# Patient Record
Sex: Female | Born: 1995 | Race: Black or African American | Hispanic: No | Marital: Single | Smoking: Never smoker
Health system: Southern US, Community
[De-identification: ages and names within clinical notes are randomized; demographics above are authoritative.]

## PROBLEM LIST (undated history)

## (undated) DIAGNOSIS — R519 Headache, unspecified: Secondary | ICD-10-CM

## (undated) DIAGNOSIS — R011 Cardiac murmur, unspecified: Secondary | ICD-10-CM

## (undated) DIAGNOSIS — R51 Headache: Secondary | ICD-10-CM

## (undated) HISTORY — DX: Headache, unspecified: R51.9

## (undated) HISTORY — DX: Cardiac murmur, unspecified: R01.1

## (undated) HISTORY — PX: WISDOM TOOTH EXTRACTION: SHX21

## (undated) HISTORY — PX: OTHER SURGICAL HISTORY: SHX169

## (undated) HISTORY — DX: Headache: R51

---
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
## 2014-03-22 ENCOUNTER — Emergency Department (HOSPITAL_COMMUNITY): Payer: BC Managed Care – PPO

## 2014-03-22 ENCOUNTER — Emergency Department (HOSPITAL_COMMUNITY)
Admission: EM | Admit: 2014-03-22 | Discharge: 2014-03-22 | Disposition: A | Discharge status: Home / Self Care | Payer: BC Managed Care – PPO | Attending: Emergency Medicine | Admitting: Emergency Medicine

## 2014-03-22 ENCOUNTER — Encounter (HOSPITAL_COMMUNITY): Payer: Self-pay | Admitting: Family Medicine

## 2014-03-22 DIAGNOSIS — R000 Tachycardia, unspecified: Secondary | ICD-10-CM | POA: Insufficient documentation

## 2014-03-22 DIAGNOSIS — R011 Cardiac murmur, unspecified: Secondary | ICD-10-CM | POA: Insufficient documentation

## 2014-03-22 DIAGNOSIS — Z3202 Encounter for pregnancy test, result negative: Secondary | ICD-10-CM | POA: Diagnosis not present

## 2014-03-22 DIAGNOSIS — R1031 Right lower quadrant pain: Secondary | ICD-10-CM | POA: Insufficient documentation

## 2014-03-22 DIAGNOSIS — R109 Unspecified abdominal pain: Secondary | ICD-10-CM | POA: Diagnosis present

## 2014-03-22 LAB — COMPREHENSIVE METABOLIC PANEL
ALK PHOS: 70 U/L (ref 39–117)
ALT: 6 U/L (ref 0–35)
AST: 18 U/L (ref 0–37)
Albumin: 4.3 g/dL (ref 3.5–5.2)
Anion gap: 14 (ref 5–15)
BUN: 9 mg/dL (ref 6–23)
CO2: 24 meq/L (ref 19–32)
Calcium: 10 mg/dL (ref 8.4–10.5)
Chloride: 98 mEq/L (ref 96–112)
Creatinine, Ser: 0.58 mg/dL (ref 0.50–1.10)
GFR calc non Af Amer: >90 mL/min (ref >90)
GLUCOSE: 88 mg/dL (ref 70–99)
Potassium: 3.4 mEq/L — ABNORMAL LOW (ref 3.7–5.3)
Sodium: 136 mEq/L — ABNORMAL LOW (ref 137–147)
Total Bilirubin: 0.3 mg/dL (ref 0.3–1.2)
Total Protein: 8.1 g/dL (ref 6.0–8.3)

## 2014-03-22 LAB — CBC WITH DIFFERENTIAL/PLATELET
Basophils Absolute: 0.1 10*3/uL (ref 0.0–0.1)
Basophils Relative: 1 % (ref 0–1)
Eosinophils Absolute: 0.2 10*3/uL (ref 0.0–0.7)
Eosinophils Relative: 2 % (ref 0–5)
HCT: 36 % (ref 36.0–46.0)
Hemoglobin: 11.8 g/dL — ABNORMAL LOW (ref 12.0–15.0)
LYMPHS ABS: 2.2 10*3/uL (ref 0.7–4.0)
LYMPHS PCT: 37 % (ref 12–46)
MCH: 23 pg — ABNORMAL LOW (ref 26.0–34.0)
MCHC: 32.8 g/dL (ref 30.0–36.0)
MCV: 70 fL — ABNORMAL LOW (ref 78.0–100.0)
Monocytes Absolute: 0.5 10*3/uL (ref 0.1–1.0)
Monocytes Relative: 8 % (ref 3–12)
NEUTROS ABS: 3.2 10*3/uL (ref 1.7–7.7)
Neutrophils Relative %: 52 % (ref 43–77)
PLATELETS: 348 10*3/uL (ref 150–400)
RBC: 5.14 MIL/uL — ABNORMAL HIGH (ref 3.87–5.11)
RDW: 15.4 % (ref 11.5–15.5)
WBC: 6.1 10*3/uL (ref 4.0–10.5)

## 2014-03-22 LAB — URINALYSIS, ROUTINE W REFLEX MICROSCOPIC
Bilirubin Urine: NEGATIVE
Glucose, UA: NEGATIVE mg/dL
Ketones, ur: NEGATIVE mg/dL
Nitrite: NEGATIVE
Protein, ur: NEGATIVE mg/dL
SPECIFIC GRAVITY, URINE: 1.021 (ref 1.005–1.030)
Urobilinogen, UA: 0.2 mg/dL (ref 0.0–1.0)
pH: 7.5 (ref 5.0–8.0)

## 2014-03-22 LAB — URINE MICROSCOPIC-ADD ON

## 2014-03-22 LAB — PREGNANCY, URINE: Preg Test, Ur: NEGATIVE

## 2014-03-22 MED ORDER — POTASSIUM CHLORIDE CRYS ER 20 MEQ PO TBCR
20.0000 meq | EXTENDED_RELEASE_TABLET | Freq: Once | ORAL | Status: AC
  Administered 2014-03-22: 20 meq via ORAL
  Filled 2014-03-22: qty 1

## 2014-03-22 NOTE — Discharge Instructions (Signed)
The cause of your abdominal pain was not identified today.  We did a CBC, CMP, Urinalysis, and Pelvic Ultrasound today.  You are slightly anemic, and your potassium was slightly low.  Take an iron supplement, available over the counter, for your anemia.  You can take ibuprofen prior to activities for your abdominal pain.   Abdominal Pain, Women Abdominal (stomach, pelvic, or belly) pain can be caused by many things. It is important to tell your doctor:  The location of the pain.  Does it come and go or is it present all the time?  Are there things that start the pain (eating certain foods, exercise)?  Are there other symptoms associated with the pain (fever, nausea, vomiting, diarrhea)? All of this is helpful to know when trying to find the cause of the pain. CAUSES   Stomach: virus or bacteria infection, or ulcer.  Intestine: appendicitis (inflamed appendix), regional ileitis (Crohn's disease), ulcerative colitis (inflamed colon), irritable bowel syndrome, diverticulitis (inflamed diverticulum of the colon), or cancer of the stomach or intestine.  Gallbladder disease or stones in the gallbladder.  Kidney disease, kidney stones, or infection.  Pancreas infection or cancer.  Fibromyalgia (pain disorder).  Diseases of the female organs:  Uterus: fibroid (non-cancerous) tumors or infection.  Fallopian tubes: infection or tubal pregnancy.  Ovary: cysts or tumors.  Pelvic adhesions (scar tissue).  Endometriosis (uterus lining tissue growing in the pelvis and on the pelvic organs).  Pelvic congestion syndrome (female organs filling up with blood just before the menstrual period).  Pain with the menstrual period.  Pain with ovulation (producing an egg).  Pain with an IUD (intrauterine device, birth control) in the uterus.  Cancer of the female organs.  Functional pain (pain not caused by a disease, may improve without treatment).  Psychological  pain.  Depression. DIAGNOSIS  Your doctor will decide the seriousness of your pain by doing an examination.  Blood tests.  X-rays.  Ultrasound.  CT scan (computed tomography, special type of X-ray).  MRI (magnetic resonance imaging).  Cultures, for infection.  Barium enema (dye inserted in the large intestine, to better view it with X-rays).  Colonoscopy (looking in intestine with a lighted tube).  Laparoscopy (minor surgery, looking in abdomen with a lighted tube).  Major abdominal exploratory surgery (looking in abdomen with a large incision). TREATMENT  The treatment will depend on the cause of the pain.   Many cases can be observed and treated at home.  Over-the-counter medicines recommended by your caregiver.  Prescription medicine.  Antibiotics, for infection.  Birth control pills, for painful periods or for ovulation pain.  Hormone treatment, for endometriosis.  Nerve blocking injections.  Physical therapy.  Antidepressants.  Counseling with a psychologist or psychiatrist.  Minor or major surgery. HOME CARE INSTRUCTIONS   Do not take laxatives, unless directed by your caregiver.  Take over-the-counter pain medicine only if ordered by your caregiver. Do not take aspirin because it can cause an upset stomach or bleeding.  Try a clear liquid diet (broth or water) as ordered by your caregiver. Slowly move to a bland diet, as tolerated, if the pain is related to the stomach or intestine.  Have a thermometer and take your temperature several times a day, and record it.  Bed rest and sleep, if it helps the pain.  Avoid sexual intercourse, if it causes pain.  Avoid stressful situations.  Keep your follow-up appointments and tests, as your caregiver orders.  If the pain does not go away with  medicine or surgery, you may try:  Acupuncture.  Relaxation exercises (yoga, meditation).  Group therapy.  Counseling. SEEK MEDICAL CARE IF:   You  notice certain foods cause stomach pain.  Your home care treatment is not helping your pain.  You need stronger pain medicine.  You want your IUD removed.  You feel faint or lightheaded.  You develop nausea and vomiting.  You develop a rash.  You are having side effects or an allergy to your medicine. SEEK IMMEDIATE MEDICAL CARE IF:   Your pain does not go away or gets worse.  You have a fever.  Your pain is felt only in portions of the abdomen. The right side could possibly be appendicitis. The left lower portion of the abdomen could be colitis or diverticulitis.  You are passing blood in your stools (bright red or black tarry stools, with or without vomiting).  You have blood in your urine.  You develop chills, with or without a fever.  You pass out. MAKE SURE YOU:   Understand these instructions.  Will watch your condition.  Will get help right away if you are not doing well or get worse. Document Released: 01/19/2007 Document Revised: 08/08/2013 Document Reviewed: 02/08/2009 [facility] Patient Information 2015 [place]. This information is not intended to replace advice given to you by your health care provider. Make sure you discuss any questions you have with your health care provider.

## 2014-03-22 NOTE — ED Notes (Signed)
Per pt sts RLQ/right pelvic pain x 2 months. sts some nausea and diarrhea. Denies vaginal, and urinary complaints.

## 2014-03-22 NOTE — ED Notes (Signed)
Pt transported to [facility]

## 2014-03-22 NOTE — ED Notes (Addendum)
Pt reports RLQ pain x2 months intermittently.  Pt reports nausea and diarrhea with no blood present in feces.  Pt reports last BM was last night. Pt denies any burning on urination.

## 2014-03-22 NOTE — ED Provider Notes (Signed)
CSN: [id number]     Arrival date & time 03/22/14  1123 History   First MD Initiated Contact with Patient 03/22/14 1458     Chief Complaint  Patient presents with  . Abdominal Pain     Patient is a 18 y.o. female presenting with abdominal pain. The history is provided by the patient.  Abdominal Pain  Marisa Fowler presents for evaluation of right lower quadrant abdominal pain. She has had intermittent pain in the right lower quadrant for the last few months. Pain may be there for a few hours one day and then gone. Pain is described as sharp and nonradiating. Pain is worse with movement. She comes in today because she was running and she had intense pain. Her pain resolved approximately 30 minutes before being seen. She denies any fevers, nausea, vomiting, dysuria, vaginal discharge. She denies any medical history other than heart murmur. Her last cycle was one month ago and it was normal. Symptoms are described as moderate and intermittent.  Past Medical History  Diagnosis Date  . Heart murmur    History reviewed. No pertinent past surgical history. Family History  Problem Relation Age of Onset  . Cancer Mother   . Diabetes Mother   . Hypertension Mother    History  Substance Use Topics  . Smoking status: Never Smoker   . Smokeless tobacco: Not on file  . Alcohol Use: No   OB History    No data available     Review of Systems  Gastrointestinal: Positive for abdominal pain.  All other systems reviewed and are negative.     Allergies  Review of patient's allergies indicates no known allergies.  Home Medications   Prior to Admission medications   Medication Sig Start Date End Date Taking? Authorizing Provider  ranitidine (ZANTAC) 150 MG tablet Take 1 tablet (150 mg total) by mouth 2 (two) times daily. Patient not taking: Reported on 03/22/2014 11/10/13   Ethelda ChickKristi M Smith, MD   BP 130/88 mmHg  Pulse 110  Temp(Src) 98.1 F (36.7 C) (Oral)  Resp 18  SpO2 100%  LMP  03/02/2014 Physical Exam  Constitutional: She is oriented to person, place, and time. She appears well-developed and well-nourished.  HENT:  Head: Normocephalic and atraumatic.  Cardiovascular: Regular rhythm.   Tachycardic, SEM at LSB  Pulmonary/Chest: Effort normal and breath sounds normal. No respiratory distress.  Abdominal: Soft. There is no tenderness. There is no rebound and no guarding.  Musculoskeletal: She exhibits no edema or tenderness.  Neurological: She is alert and oriented to person, place, and time.  Skin: Skin is warm and dry.  Psychiatric: She has a normal mood and affect. Her behavior is normal.  Nursing note and vitals reviewed.   ED Course  Procedures (including critical care time) Labs Review Labs Reviewed  URINALYSIS, ROUTINE W REFLEX MICROSCOPIC - Abnormal; Notable for the following:    APPearance HAZY (*)    Hgb urine dipstick SMALL (*)    Leukocytes, UA MODERATE (*)    All other components within normal limits  URINE MICROSCOPIC-ADD ON - Abnormal; Notable for the following:    Squamous Epithelial / LPF MANY (*)    Bacteria, UA FEW (*)    All other components within normal limits  CBC WITH DIFFERENTIAL - Abnormal; Notable for the following:    RBC 5.14 (*)    Hemoglobin 11.8 (*)    MCV 70.0 (*)    MCH 23.0 (*)    All other components within  normal limits  COMPREHENSIVE METABOLIC PANEL - Abnormal; Notable for the following:    Sodium 136 (*)    Potassium 3.4 (*)    All other components within normal limits  PREGNANCY, URINE  POC URINE PREG, ED    Imaging Review [place] Transvaginal Non-ob  03/22/2014   CLINICAL DATA:  Right lower quadrant abdominal pain x2 months  EXAM: TRANSABDOMINAL AND TRANSVAGINAL ULTRASOUND OF PELVIS  TECHNIQUE: Both transabdominal and transvaginal ultrasound examinations of the pelvis were performed. Transabdominal technique was performed for global imaging of the pelvis including uterus, ovaries, adnexal regions, and pelvic  cul-de-sac. It was necessary to proceed with endovaginal exam following the transabdominal exam to visualize the uterus and bilateral ovaries.  COMPARISON:  None  FINDINGS: Uterus  Measurements: 7.4 x 3.1 x 4.2 cm. No fibroids or other mass visualized.  Endometrium  Thickness: 10 mm.  Trace fluid in the endocervical canal.  Right ovary  Measurements: 3.2 x 2.6 x 2.7 cm. Normal appearance/no adnexal mass.  Left ovary  Measurements: 3.7 x 2.1 x 3.0 cm. Normal appearance/no adnexal mass.  Other findings  No free fluid.  IMPRESSION: Negative pelvic ultrasound.   Electronically Signed   By: Charline BillsSriyesh  Krishnan M.D.   On: 03/22/2014 16:48   [place] Pelvis Complete  03/22/2014   CLINICAL DATA:  Right lower quadrant abdominal pain x2 months  EXAM: TRANSABDOMINAL AND TRANSVAGINAL ULTRASOUND OF PELVIS  TECHNIQUE: Both transabdominal and transvaginal ultrasound examinations of the pelvis were performed. Transabdominal technique was performed for global imaging of the pelvis including uterus, ovaries, adnexal regions, and pelvic cul-de-sac. It was necessary to proceed with endovaginal exam following the transabdominal exam to visualize the uterus and bilateral ovaries.  COMPARISON:  None  FINDINGS: Uterus  Measurements: 7.4 x 3.1 x 4.2 cm. No fibroids or other mass visualized.  Endometrium  Thickness: 10 mm.  Trace fluid in the endocervical canal.  Right ovary  Measurements: 3.2 x 2.6 x 2.7 cm. Normal appearance/no adnexal mass.  Left ovary  Measurements: 3.7 x 2.1 x 3.0 cm. Normal appearance/no adnexal mass.  Other findings  No free fluid.  IMPRESSION: Negative pelvic ultrasound.   Electronically Signed   By: Charline BillsSriyesh  Krishnan M.D.   On: 03/22/2014 16:48     EKG Interpretation None      MDM   Final diagnoses:  RLQ abdominal pain    Patient presents for evaluation of right lower quadrant abdominal pain. Pain has been ongoing for the last 1-1/2-2 months, patient is pain-free in the emergency department. Clinical  picture is not consistent with appendicitis or renal colic. Initial concern for potential ovarian cysts, pelvic ultrasound is negative for cyst or ovarian pathology. Patient declines pelvic exam and emergency department she is not sexually active, feel PID unlikely at this time. As with patient findings of labs and need for PCP follow-up revealed pain may be musculoskeletal in origin, recommend ibuprofen prior to running or vigorous activities to assess for relief.     Tilden FossaElizabeth Donnavan Covault, MD 03/22/14 1705

## 2014-03-22 NOTE — ED Notes (Signed)
Pt made aware to return if symptoms worsen or if any life threatening symptoms occur.   

## 2015-06-19 ENCOUNTER — Encounter: Payer: Self-pay | Admitting: Neurology

## 2015-06-19 ENCOUNTER — Ambulatory Visit (INDEPENDENT_AMBULATORY_CARE_PROVIDER_SITE_OTHER): Payer: BLUE CROSS/BLUE SHIELD | Admitting: Neurology

## 2015-06-19 VITALS — BP 142/91 | HR 109 | Ht 62.0 in | Wt 108.2 lb

## 2015-06-19 DIAGNOSIS — Z658 Other specified problems related to psychosocial circumstances: Secondary | ICD-10-CM

## 2015-06-19 DIAGNOSIS — S0990XA Unspecified injury of head, initial encounter: Secondary | ICD-10-CM

## 2015-06-19 DIAGNOSIS — R519 Headache, unspecified: Secondary | ICD-10-CM | POA: Insufficient documentation

## 2015-06-19 DIAGNOSIS — R41840 Attention and concentration deficit: Secondary | ICD-10-CM | POA: Diagnosis not present

## 2015-06-19 DIAGNOSIS — R51 Headache: Secondary | ICD-10-CM

## 2015-06-19 DIAGNOSIS — Z634 Disappearance and death of family member: Secondary | ICD-10-CM | POA: Diagnosis not present

## 2015-06-19 DIAGNOSIS — R413 Other amnesia: Secondary | ICD-10-CM | POA: Diagnosis not present

## 2015-06-19 DIAGNOSIS — F439 Reaction to severe stress, unspecified: Secondary | ICD-10-CM

## 2015-06-19 DIAGNOSIS — G478 Other sleep disorders: Secondary | ICD-10-CM

## 2015-06-19 NOTE — Patient Instructions (Signed)
Remember to drink plenty of fluid, eat healthy meals and do not skip any meals. Try to eat protein with a every meal and eat a healthy snack such as fruit or nuts in between meals. Try to keep a regular sleep-wake schedule and try to exercise daily, particularly in the form of walking, 20-30 minutes a day, if you can.   As far as your medications are concerned, I would like to suggest: None at this time  As far as diagnostic testing: None at this time  I would like to see you back as needed, sooner if we need to. Please call us with any interim questions, concerns, problems, updates or refill requests.   Our phone number is [phone]. We also have an after hours call service for urgent matters and there is a physician on-call for urgent questions. For any emergencies you know to call 911 or go to the nearest emergency room
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
## 2015-06-20 DIAGNOSIS — F0781 Postconcussional syndrome: Secondary | ICD-10-CM | POA: Insufficient documentation

## 2016-06-28 ENCOUNTER — Ambulatory Visit (INDEPENDENT_AMBULATORY_CARE_PROVIDER_SITE_OTHER): Payer: BLUE CROSS/BLUE SHIELD | Admitting: Emergency Medicine

## 2016-06-28 VITALS — BP 117/78 | HR 92 | Temp 98.4°F | Resp 17 | Ht 63.0 in | Wt 107.0 lb

## 2016-06-28 DIAGNOSIS — J209 Acute bronchitis, unspecified: Secondary | ICD-10-CM | POA: Diagnosis not present

## 2016-06-28 DIAGNOSIS — R05 Cough: Secondary | ICD-10-CM | POA: Diagnosis not present

## 2016-06-28 DIAGNOSIS — R059 Cough, unspecified: Secondary | ICD-10-CM

## 2016-06-28 MED ORDER — AZITHROMYCIN 250 MG PO TABS: ORAL_TABLET | ORAL | 0 refills | Status: DC

## 2016-06-28 MED ORDER — BENZONATATE 200 MG PO CAPS: 200.0000 mg | ORAL_CAPSULE | Freq: Two times a day (BID) | ORAL | 0 refills | Status: DC | PRN

## 2016-06-28 MED ORDER — PROMETHAZINE-CODEINE 6.25-10 MG/5ML PO SYRP: 5.0000 mL | ORAL_SOLUTION | Freq: Every evening | ORAL | 0 refills | Status: DC | PRN

## 2016-06-28 NOTE — Patient Instructions (Addendum)
     IF you received an x-ray today, you will receive an invoice from [facility]. Please contact [facility] at [phone] with questions or concerns regarding your invoice.   IF you received labwork today, you will receive an invoice from [facility]. Please contact [facility] at [phone] with questions or concerns regarding your invoice.   Our billing staff will not be able to assist you with questions regarding bills from these companies.  You will be contacted with the lab results as soon as they are available. The fastest way to get your results is to activate your My Chart account. Instructions are located on the last page of this paperwork. If you have not heard from us regarding the results in 2 weeks, please contact this office.      Acute Bronchitis, Adult Acute bronchitis is when air tubes (bronchi) in the lungs suddenly get swollen. The condition can make it hard to breathe. It can also cause these symptoms:  A cough.  Coughing up clear, yellow, or green mucus.  Wheezing.  Chest congestion.  Shortness of breath.  A fever.  Body aches.  Chills.  A sore throat.  Follow these instructions at home: Medicines  Take over-the-counter and prescription medicines only as told by your doctor.  If you were prescribed an antibiotic medicine, take it as told by your doctor. Do not stop taking the antibiotic even if you start to feel better. General instructions  Rest.  Drink enough fluids to keep your pee (urine) clear or pale yellow.  Avoid smoking and secondhand smoke. If you smoke and you need help quitting, ask your doctor. Quitting will help your lungs heal faster.  Use an inhaler, cool mist vaporizer, or humidifier as told by your doctor.  Keep all follow-up visits as told by your doctor. This is important. How is this prevented? To lower your risk of getting this condition again:  Wash your hands often with soap and water. If you cannot  use soap and water, use hand sanitizer.  Avoid contact with people who have cold symptoms.  Try not to touch your hands to your mouth, nose, or eyes.  Make sure to get the flu shot every year.  Contact a doctor if:  Your symptoms do not get better in 2 weeks. Get help right away if:  You cough up blood.  You have chest pain.  You have very bad shortness of breath.  You become dehydrated.  You faint (pass out) or keep feeling like you are going to pass out.  You keep throwing up (vomiting).  You have a very bad headache.  Your fever or chills gets worse. This information is not intended to replace advice given to you by your health care provider. Make sure you discuss any questions you have with your health care provider. Document Released: 09/10/2007 Document Revised: 10/31/2015 Document Reviewed: 09/12/2015 Elsevier Interactive Patient Education  2017 [employer]  

## 2016-06-28 NOTE — Progress Notes (Signed)
Marisa Fowler 21 y.o.   Chief Complaint  Patient presents with  . Cough    HISTORY OF PRESENT ILLNESS: This is a 21 y.o. female complaining of productive cough x 2 weeks.  Cough  This is a new problem. The current episode started 1 to 4 weeks ago. The problem has been gradually worsening. The problem occurs every few minutes. The cough is productive of sputum and productive of purulent sputum. Associated symptoms include nasal congestion and a sore throat (improved). Pertinent negatives include no chest pain, chills, ear congestion, ear pain, eye redness, fever, headaches, hemoptysis, myalgias, rash, rhinorrhea, shortness of breath, weight loss or wheezing. Nothing aggravates the symptoms. Risk factors: none. She has tried OTC cough suppressant for the symptoms. The treatment provided no relief. There is no history of asthma, bronchitis, COPD or pneumonia.     Prior to Admission medications   Medication Sig Start Date End Date Taking? Authorizing Provider  JUNEL 1/20 1-20 MG-MCG tablet  06/15/15  Yes Historical Provider, MD  azithromycin (ZITHROMAX) 250 MG tablet Sig as indicated 06/28/16   [facility], MD  benzonatate (TESSALON) 200 MG capsule Take 1 capsule (200 mg total) by mouth 2 (two) times daily as needed for cough. 06/28/16   Georgina Quint, MD  promethazine-codeine [facility] WITH CODEINE) 6.25-10 MG/5ML syrup Take 5 mLs by mouth at bedtime as needed for cough. 06/28/16   Georgina Quint, MD    No Known Allergies  Patient Active Problem List   Diagnosis Date Noted  . Acute bronchitis 06/28/2016  . Headache 06-27-15  . Head injury due to trauma 2015-06-27  . Memory changes 27-Jun-2015  . Concentration deficit 06-27-2015  . Unhealthy sleep habit 06/27/2015  . Stress June 27, 2015  . Death of family member 06/27/2015  . DIZZINESS 03/23/2009    Past Medical History:  Diagnosis Date  . Headache   . Heart murmur     Past Surgical History:  Procedure  Laterality Date  . no syrgical history      Social History   Social History  . Marital status: Single    Spouse name: N/A  . Number of children: N/A  . Years of education: N/A   Occupational History  . Not on file.   Social History Main Topics  . Smoking status: Never Smoker  . Smokeless tobacco: Never Used  . Alcohol use 0.6 oz/week    1 Shots of liquor per week     Comment: socislly  . Drug use: No  . Sexual activity: No   Other Topics Concern  . Not on file   Social History Narrative  . No narrative on file    Family History  Problem Relation Age of Onset  . Cancer Mother   . Diabetes Mother   . Hypertension Mother   . Migraines Neg Hx      Review of Systems  Constitutional: Negative for chills, fever and weight loss.  HENT: Positive for congestion and sore throat (improved). Negative for ear discharge, ear pain, nosebleeds and rhinorrhea.   Eyes: Negative for discharge and redness.  Respiratory: Positive for cough and sputum production. Negative for hemoptysis, shortness of breath and wheezing.   Cardiovascular: Negative for chest pain, palpitations and leg swelling.  Gastrointestinal: Negative for abdominal pain, diarrhea, nausea and vomiting.  Genitourinary: Negative for dysuria and hematuria.  Musculoskeletal: Negative for back pain, myalgias and neck pain.  Skin: Negative for rash.  Neurological: Positive for weakness. Negative for dizziness, sensory change,  focal weakness and headaches.  Endo/Heme/Allergies: Negative.   All other systems reviewed and are negative.  Vitals:   06/28/16 0834  BP: 117/78  Pulse: 92  Resp: 17  Temp: 98.4 F (36.9 C)     Physical Exam  Constitutional: She is oriented to person, place, and time. She appears well-developed and well-nourished.  HENT:  Head: Normocephalic and atraumatic.  Right Ear: External ear normal.  Left Ear: External ear normal.  Nose: Nose normal.  Mouth/Throat: Oropharynx is clear and  moist. No oropharyngeal exudate.  Eyes: Conjunctivae and EOM are normal. Pupils are equal, round, and reactive to light.  Neck: Normal range of motion. Neck supple. No JVD present. No thyromegaly present.  Cardiovascular: Normal rate, regular rhythm, normal heart sounds and intact distal pulses.   Pulmonary/Chest: Effort normal and breath sounds normal. She has no wheezes. She has no rales.  Abdominal: Soft. Bowel sounds are normal. She exhibits no distension. There is no tenderness.  Musculoskeletal: Normal range of motion.  Lymphadenopathy:    She has no cervical adenopathy.  Neurological: She is alert and oriented to person, place, and time. No sensory deficit. She exhibits normal muscle tone. Coordination normal.  Skin: Skin is warm and dry. Capillary refill takes less than 2 seconds. No rash noted.  Psychiatric: She has a normal mood and affect. Her behavior is normal.  Vitals reviewed. O2 sat normal 97%   ASSESSMENT & PLAN: Levonia was seen today for cough.  Diagnoses and all orders for this visit:  Acute bronchitis, unspecified organism  Cough -     promethazine-codeine (PHENERGAN WITH CODEINE) 6.25-10 MG/5ML syrup; Take 5 mLs by mouth at bedtime as needed for cough.  Other orders -     azithromycin (ZITHROMAX) 250 MG tablet; Sig as indicated -     benzonatate (TESSALON) 200 MG capsule; Take 1 capsule (200 mg total) by mouth 2 (two) times daily as needed for cough. -     Discontinue: promethazine-codeine (PHENERGAN WITH CODEINE) 6.25-10 MG/5ML syrup; Take 5 mLs by mouth at bedtime as needed for cough.    Patient Instructions       IF you received an x-ray today, you will receive an invoice from [facility]. Please contact [facility] at [phone] with questions or concerns regarding your invoice.   IF you received labwork today, you will receive an invoice from [facility]. Please contact [facility] at [phone] with questions or concerns regarding  your invoice.   Our billing staff will not be able to assist you with questions regarding bills from these companies.  You will be contacted with the lab results as soon as they are available. The fastest way to get your results is to activate your My Chart account. Instructions are located on the last page of this paperwork. If you have not heard from [place] regarding the results in 2 weeks, please contact this office.      Acute Bronchitis, Adult Acute bronchitis is when air tubes (bronchi) in the lungs suddenly get swollen. The condition can make it hard to breathe. It can also cause these symptoms:  A cough.  Coughing up clear, yellow, or green mucus.  Wheezing.  Chest congestion.  Shortness of breath.  A fever.  Body aches.  Chills.  A sore throat. Follow these instructions at home: Medicines   Take over-the-counter and prescription medicines only as told by your doctor.  If you were prescribed an antibiotic medicine, take it as told by your doctor. Do not stop  taking the antibiotic even if you start to feel better. General instructions   Rest.  Drink enough fluids to keep your pee (urine) clear or pale yellow.  Avoid smoking and secondhand smoke. If you smoke and you need help quitting, ask your doctor. Quitting will help your lungs heal faster.  Use an inhaler, cool mist vaporizer, or humidifier as told by your doctor.  Keep all follow-up visits as told by your doctor. This is important. How is this prevented? To lower your risk of getting this condition again:  Wash your hands often with soap and water. If you cannot use soap and water, use hand sanitizer.  Avoid contact with people who have cold symptoms.  Try not to touch your hands to your mouth, nose, or eyes.  Make sure to get the flu shot every year. Contact a doctor if:  Your symptoms do not get better in 2 weeks. Get help right away if:  You cough up blood.  You have chest pain.  You have  very bad shortness of breath.  You become dehydrated.  You faint (pass out) or keep feeling like you are going to pass out.  You keep throwing up (vomiting).  You have a very bad headache.  Your fever or chills gets worse. This information is not intended to replace advice given to you by your health care provider. Make sure you discuss any questions you have with your health care provider. Document Released: 09/10/2007 Document Revised: 10/31/2015 Document Reviewed: 09/12/2015 Elsevier Interactive Patient Education  2017 Elsevier Inc.      Edwina BarthMiguel Correna Meacham, MD [facility]

## 2016-12-23 ENCOUNTER — Emergency Department (HOSPITAL_COMMUNITY): Payer: BLUE CROSS/BLUE SHIELD

## 2016-12-23 ENCOUNTER — Encounter (HOSPITAL_COMMUNITY): Payer: Self-pay

## 2016-12-23 DIAGNOSIS — R1031 Right lower quadrant pain: Secondary | ICD-10-CM | POA: Diagnosis present

## 2016-12-23 LAB — CBC WITH DIFFERENTIAL/PLATELET
BASOS PCT: 1 %
Basophils Absolute: 0.1 10*3/uL (ref 0.0–0.1)
Eosinophils Absolute: 0.1 10*3/uL (ref 0.0–0.7)
Eosinophils Relative: 1 %
HCT: 36.4 % (ref 36.0–46.0)
Hemoglobin: 12 g/dL (ref 12.0–15.0)
LYMPHS ABS: 1.3 10*3/uL (ref 0.7–4.0)
Lymphocytes Relative: 16 %
MCH: 22.7 pg — AB (ref 26.0–34.0)
MCHC: 33 g/dL (ref 30.0–36.0)
MCV: 68.8 fL — ABNORMAL LOW (ref 78.0–100.0)
Monocytes Absolute: 0.9 10*3/uL (ref 0.1–1.0)
Monocytes Relative: 12 %
NEUTROS PCT: 70 %
Neutro Abs: 5.5 10*3/uL (ref 1.7–7.7)
Platelets: 328 10*3/uL (ref 150–400)
RBC: 5.29 MIL/uL — ABNORMAL HIGH (ref 3.87–5.11)
RDW: 15.4 % (ref 11.5–15.5)
WBC: 7.9 10*3/uL (ref 4.0–10.5)

## 2016-12-23 LAB — COMPREHENSIVE METABOLIC PANEL
ALT: 27 U/L (ref 14–54)
ANION GAP: 10 (ref 5–15)
AST: 32 U/L (ref 15–41)
Albumin: 4.2 g/dL (ref 3.5–5.0)
Alkaline Phosphatase: 40 U/L (ref 38–126)
BUN: 9 mg/dL (ref 6–20)
CALCIUM: 9.6 mg/dL (ref 8.9–10.3)
CO2: 24 mmol/L (ref 22–32)
CREATININE: 0.82 mg/dL (ref 0.44–1.00)
Chloride: 102 mmol/L (ref 101–111)
GFR calc non Af Amer: >60 mL/min (ref >60)
Glucose, Bld: 97 mg/dL (ref 65–99)
Potassium: 3.5 mmol/L (ref 3.5–5.1)
Sodium: 136 mmol/L (ref 135–145)
Total Bilirubin: 0.4 mg/dL (ref 0.3–1.2)
Total Protein: 7.4 g/dL (ref 6.5–8.1)

## 2016-12-23 LAB — URINALYSIS, ROUTINE W REFLEX MICROSCOPIC
Bacteria, UA: NONE SEEN
Bilirubin Urine: NEGATIVE
GLUCOSE, UA: NEGATIVE mg/dL
KETONES UR: NEGATIVE mg/dL
Leukocytes, UA: NEGATIVE
NITRITE: NEGATIVE
PH: 8 (ref 5.0–8.0)
Protein, ur: NEGATIVE mg/dL
SQUAMOUS EPITHELIAL / LPF: NONE SEEN
Specific Gravity, Urine: 1.012 (ref 1.005–1.030)

## 2016-12-23 LAB — I-STAT CG4 LACTIC ACID, ED: Lactic Acid, Venous: 1.34 mmol/L (ref 0.5–1.9)

## 2016-12-23 LAB — PROTIME-INR
INR: 1
PROTHROMBIN TIME: 13.1 s (ref 11.4–15.2)

## 2016-12-23 NOTE — ED Triage Notes (Signed)
Pt started having abd pain 3 days ago, RLQ pain, very tender to touch, some n/v. Fever 101 HR 113.

## 2016-12-24 ENCOUNTER — Emergency Department (HOSPITAL_COMMUNITY)
Admission: EM | Admit: 2016-12-24 | Discharge: 2016-12-24 | Disposition: A | Discharge status: Home / Self Care | Payer: BLUE CROSS/BLUE SHIELD | Attending: Emergency Medicine | Admitting: Emergency Medicine

## 2016-12-24 ENCOUNTER — Emergency Department (HOSPITAL_COMMUNITY): Payer: BLUE CROSS/BLUE SHIELD

## 2016-12-24 DIAGNOSIS — R1031 Right lower quadrant pain: Secondary | ICD-10-CM

## 2016-12-24 LAB — WET PREP, GENITAL
Sperm: NONE SEEN
Trich, Wet Prep: NONE SEEN
Yeast Wet Prep HPF POC: NONE SEEN

## 2016-12-24 LAB — I-STAT BETA HCG BLOOD, ED (MC, WL, AP ONLY): I-stat hCG, quantitative: <5 m[IU]/mL (ref <5)

## 2016-12-24 MED ORDER — IOPAMIDOL (ISOVUE-300) INJECTION 61%
INTRAVENOUS | Status: AC
  Administered 2016-12-24: 100 mL
  Filled 2016-12-24: qty 100

## 2016-12-24 MED ORDER — NAPROXEN 500 MG PO TABS: 500.0000 mg | ORAL_TABLET | Freq: Two times a day (BID) | ORAL | 0 refills | Status: DC

## 2016-12-24 NOTE — ED Notes (Signed)
Helped the PA with pelvic exam patient is resting with family at bedside

## 2016-12-24 NOTE — ED Provider Notes (Signed)
[facility]-EMERGENCY DEPT Provider Note   CSN: [id number] Arrival date & time: 12/23/16  2101     History   Chief Complaint Chief Complaint  Patient presents with  . Abdominal Pain    HPI Marisa Fowler is a 21 y.o. female.  Patient with no past surgical history -- presents with complaint of fever and lower abdominal pain for the past 3 days. Patient has also had nasal congestion and sore throat for the past 1 day. Pain radiates to her right lower back. She denies chest pain, shortness of breath, cough, diarrhea, urinary symptoms, vaginal bleeding or discharge. Denies being sexually active, denies concern for STI. No treatments prior to arrival. No known sick contacts. The onset of this condition acute. The course was intermittent at onset, now more persistent. . Aggravating factors: none. Alleviating factors: none.        Past Medical History:  Diagnosis Date  . Headache   . Heart murmur     Patient Active Problem List   Diagnosis Date Noted  . Acute bronchitis 06/28/2016  . Headache Jul 19, 2015  . Head injury due to trauma Jul 19, 2015  . Memory changes 07-19-2015  . Concentration deficit 2015/07/19  . Unhealthy sleep habit July 19, 2015  . Stress 2015/07/19  . Death of family member 07/19/15  . DIZZINESS 03/23/2009    Past Surgical History:  Procedure Laterality Date  . no syrgical history      OB History    No data available       Home Medications    Prior to Admission medications   Medication Sig Start Date End Date Taking? Authorizing Provider  azithromycin (ZITHROMAX) 250 MG tablet Sig as indicated 06/28/16   Georgina Quint, MD  benzonatate (TESSALON) 200 MG capsule Take 1 capsule (200 mg total) by mouth 2 (two) times daily as needed for cough. 06/28/16   Georgina Quint, MD  JUNEL 1/20 1-20 MG-MCG tablet  06/15/15   [provider]  promethazine-codeine (PHENERGAN WITH CODEINE) 6.25-10 MG/5ML syrup Take 5 mLs by mouth at bedtime as needed  for cough. 06/28/16   Georgina Quint, MD    Family History Family History  Problem Relation Age of Onset  . Cancer Mother   . Diabetes Mother   . Hypertension Mother   . Migraines Neg Hx     Social History Social History  Substance Use Topics  . Smoking status: Never Smoker  . Smokeless tobacco: Never Used  . Alcohol use 0.6 oz/week    1 Shots of liquor per week     Comment: socislly     Allergies   Patient has no known allergies.   Review of Systems Review of Systems  Constitutional: Negative for fever.  HENT: Negative for rhinorrhea and sore throat.   Eyes: Negative for redness.  Respiratory: Negative for cough.   Cardiovascular: Negative for chest pain.  Gastrointestinal: Positive for abdominal pain. Negative for diarrhea, nausea and vomiting.  Genitourinary: Negative for dysuria, hematuria, vaginal bleeding and vaginal discharge.  Musculoskeletal: Negative for myalgias.  Skin: Negative for rash.  Neurological: Negative for headaches.     Physical Exam Updated Vital Signs BP (!) 132/94   Pulse 89   Temp 98.7 F (37.1 C) (Oral)   Resp 18   LMP 12/16/2016   SpO2 99%   Physical Exam  Constitutional: She appears well-developed and well-nourished.  HENT:  Head: Normocephalic and atraumatic.  Mouth/Throat: Mucous membranes are normal. Posterior oropharyngeal erythema present. No oropharyngeal exudate or posterior  oropharyngeal edema.  Eyes: Conjunctivae are normal. Right eye exhibits no discharge. Left eye exhibits no discharge.  Neck: Normal range of motion. Neck supple.  Cardiovascular: Normal rate, regular rhythm and normal heart sounds.   Pulmonary/Chest: Effort normal and breath sounds normal. No respiratory distress. She has no wheezes. She has no rales.  Abdominal: Soft. There is tenderness (RLQ, mild). There is no rebound and no guarding.  Neg Rovsings  Genitourinary: Uterus normal. Pelvic exam was performed with patient supine. There is no  rash or tenderness on the right labia. There is no rash or tenderness on the left labia. Uterus is not tender. Cervix exhibits no motion tenderness, no discharge and no friability. Right adnexum displays no mass and no tenderness. Left adnexum displays no mass and no tenderness. No tenderness in the vagina. Vaginal discharge (thin, white) found.  Neurological: She is alert.  Skin: Skin is warm and dry.  Psychiatric: She has a normal mood and affect.  Nursing note and vitals reviewed.    ED Treatments / Results  Labs (all labs ordered are listed, but only abnormal results are displayed) Labs Reviewed  WET PREP, GENITAL - Abnormal; Notable for the following:       Result Value   Clue Cells Wet Prep HPF POC PRESENT (*)    WBC, Wet Prep HPF POC MANY (*)    All other components within normal limits  CBC WITH DIFFERENTIAL/PLATELET - Abnormal; Notable for the following:    RBC 5.29 (*)    MCV 68.8 (*)    MCH 22.7 (*)    All other components within normal limits  URINALYSIS, ROUTINE W REFLEX MICROSCOPIC - Abnormal; Notable for the following:    Hgb urine dipstick MODERATE (*)    All other components within normal limits  CULTURE, BLOOD (ROUTINE X 2)  CULTURE, BLOOD (ROUTINE X 2)  COMPREHENSIVE METABOLIC PANEL  PROTIME-INR  I-STAT CG4 LACTIC ACID, ED  I-STAT BETA HCG BLOOD, ED ([facility], [facility], [facility] ONLY)  GC/CHLAMYDIA PROBE AMP ([place]) NOT AT [facility]    EKG  EKG Interpretation None       Radiology Dg Chest 2 View  Result Date: 12/24/2016 CLINICAL DATA:  Fever, cough, and congestion today. Right upper quadrant abdominal pain for 3 days. Nausea. EXAM: CHEST  2 VIEW COMPARISON:  11/10/2013 FINDINGS: The heart size and mediastinal contours are within normal limits. Both lungs are clear. The visualized skeletal structures are unremarkable. IMPRESSION: No active cardiopulmonary disease. Electronically Signed   By: Burman Nieves M.D.   On: 12/24/2016 00:15   Ct Abdomen Pelvis W  Contrast  Result Date: 12/24/2016 CLINICAL DATA:  Lower right-sided pain for 3 days. Fevers constipation. Nausea. EXAM: CT ABDOMEN AND PELVIS WITH CONTRAST TECHNIQUE: Multidetector CT imaging of the abdomen and pelvis was performed using the standard protocol following bolus administration of intravenous contrast. CONTRAST:  ISOVUE-300 IOPAMIDOL (ISOVUE-300) INJECTION 61% COMPARISON:  None. FINDINGS: Lower chest: Lung bases are clear. Hepatobiliary: No focal liver abnormality is seen. No gallstones, gallbladder wall thickening, or biliary dilatation. Pancreas: Unremarkable. No pancreatic ductal dilatation or surrounding inflammatory changes. Spleen: Normal in size without focal abnormality. Adrenals/Urinary Tract: Adrenal glands are unremarkable. Kidneys are normal, without renal calculi, focal lesion, or hydronephrosis. Bladder is unremarkable. Stomach/Bowel: Stomach is within normal limits. Appendix is not identified. No evidence of bowel wall thickening, distention, or inflammatory changes. Vascular/Lymphatic: No significant vascular findings are present. No enlarged abdominal or pelvic lymph nodes. Reproductive: Uterus and bilateral adnexa are unremarkable.  Other: No abdominal wall hernia or abnormality. No abdominopelvic ascites. Musculoskeletal: No acute or significant osseous findings. IMPRESSION: No acute process demonstrated in the abdomen or pelvis. No evidence of bowel obstruction or inflammation. Appendix is not identified. Electronically Signed   By: Burman Nieves M.D.   On: 12/24/2016 05:33    Procedures Procedures (including critical care time)  Medications Ordered in ED Medications  iopamidol (ISOVUE-300) 61 % injection (100 mLs  Contrast Given 12/24/16 0508)     Initial Impression / Assessment and Plan / ED Course  I have reviewed the triage vital signs and the nursing notes.  Pertinent labs & imaging results that were available during my care of the patient were reviewed by  me and considered in my medical decision making (see chart for details).     Patient seen and examined. Updated patient and family member on results. Will proceed with pelvic exam. Patient appears comfortable.  Vital signs reviewed and are as follows: BP (!) 132/94   Pulse 89   Temp 98.7 F (37.1 C) (Oral)   Resp 18   LMP 12/16/2016   SpO2 99%   8:42 AM Pelvic exam performed with RN chaperone.   9:23 AM Wet prep reviewed. Patient without compelling evidence for BV on exam. Will treat symptomatically. Discussed with patient.  The patient was urged to return to the Emergency Department immediately with worsening of current symptoms, worsening abdominal pain, persistent vomiting, blood noted in stools, fever, or any other concerns. The patient verbalized understanding.    Final Clinical Impressions(s) / ED Diagnoses   Final diagnoses:  Right lower quadrant abdominal pain   Patient with abdominal pain, URI symptoms, fever. Vitals are stable, fever now resolved. Labs reassuring. Imaging no signs of appendicitis or other pelvic etiology. No signs suggestive of PID or TOA on exam. No signs of dehydration, patient is tolerating PO's. Lungs are clear and no signs suggestive of PNA. Low concern for appendicitis, cholecystitis, pancreatitis, ruptured viscus, UTI, kidney stone, aortic dissection, aortic aneurysm or other emergent abdominal etiology. Supportive therapy indicated with return if symptoms worsen.    New Prescriptions New Prescriptions   NAPROXEN (NAPROSYN) 500 MG TABLET    Take 1 tablet (500 mg total) by mouth 2 (two) times daily.     Renne Crigler, PA-C 12/24/16 1610    Tilden Fossa, MD 12/25/16 [phone]

## 2016-12-24 NOTE — Discharge Instructions (Signed)
Please read and follow all provided instructions.  Your diagnoses today include:  1. Right lower quadrant abdominal pain     Tests performed today include:  Blood counts and electrolytes  Blood tests to check liver and kidney function  Blood tests to check pancreas function  Urine test to look for infection and pregnancy (in women)  CT of the abdomen and pelvis - no appendicitis or other infection  Wet prep - no vaginal infection  Vital signs. See below for your results today.   Medications prescribed:   Naproxen - anti-inflammatory pain medication  Do not exceed  naproxen every 12 hours, take with food  You have been prescribed an anti-inflammatory medication or NSAID. Take with food. Take smallest effective dose for the shortest duration needed for your pain. Stop taking if you experience stomach pain or vomiting.   Take any prescribed medications only as directed.  Home care instructions:   Follow any educational materials contained in this packet.  Follow-up instructions: Please follow-up with your primary care provider in the next 2 days for further evaluation of your symptoms.    Return instructions:  SEEK IMMEDIATE MEDICAL ATTENTION IF:  The pain does not go away or becomes severe   A temperature above 101F develops   Repeated vomiting occurs (multiple episodes)   The pain becomes localized to portions of the abdomen. The right side could possibly be appendicitis. In an adult, the left lower portion of the abdomen could be colitis or diverticulitis.   Blood is being passed in stools or vomit (bright red or black tarry stools)   You develop chest pain, difficulty breathing, dizziness or fainting, or become confused, poorly responsive, or inconsolable (young children)  If you have any other emergent concerns regarding your health  Additional Information: Abdominal (belly) pain can be caused by many things. Your caregiver performed an examination and  possibly ordered blood/urine tests and imaging (CT scan, x-rays, ultrasound). Many cases can be observed and treated at home after initial evaluation in the emergency department. Even though you are being discharged home, abdominal pain can be unpredictable. Therefore, you need a repeated exam if your pain does not resolve, returns, or worsens. Most patients with abdominal pain don't have to be admitted to the hospital or have surgery, but serious problems like appendicitis and gallbladder attacks can start out as nonspecific pain. Many abdominal conditions cannot be diagnosed in one visit, so follow-up evaluations are very important.  Your vital signs today were: BP 118/88 (BP Location: Right Arm)    Pulse 84    Temp 98.7 F (37.1 C) (Oral)    Resp 20    LMP 12/16/2016    SpO2 100%  If your blood pressure (bp) was elevated above 135/85 this visit, please have this repeated by your doctor within one month. --------------

## 2016-12-25 LAB — GC/CHLAMYDIA PROBE AMP (~~LOC~~) NOT AT ARMC
CHLAMYDIA, DNA PROBE: NEGATIVE
NEISSERIA GONORRHEA: NEGATIVE

## 2016-12-29 LAB — CULTURE, BLOOD (ROUTINE X 2)
CULTURE: NO GROWTH
Culture: NO GROWTH
Special Requests: ADEQUATE

## 2017-11-25 ENCOUNTER — Encounter: Payer: Self-pay | Admitting: Family Medicine

## 2017-11-25 ENCOUNTER — Ambulatory Visit (INDEPENDENT_AMBULATORY_CARE_PROVIDER_SITE_OTHER): Payer: BLUE CROSS/BLUE SHIELD | Admitting: Family Medicine

## 2017-11-25 VITALS — BP 110/78 | HR 86 | Temp 98.8°F | Ht 63.0 in | Wt 105.0 lb

## 2017-11-25 DIAGNOSIS — R5383 Other fatigue: Secondary | ICD-10-CM | POA: Diagnosis not present

## 2017-11-25 DIAGNOSIS — Z131 Encounter for screening for diabetes mellitus: Secondary | ICD-10-CM | POA: Diagnosis not present

## 2017-11-25 DIAGNOSIS — Z0000 Encounter for general adult medical examination without abnormal findings: Secondary | ICD-10-CM | POA: Diagnosis not present

## 2017-11-25 LAB — CBC WITH DIFFERENTIAL/PLATELET
BASOS ABS: 0.1 10*3/uL (ref 0.0–0.1)
BASOS PCT: 1.4 % (ref 0.0–3.0)
EOS ABS: 0.2 10*3/uL (ref 0.0–0.7)
Eosinophils Relative: 4 % (ref 0.0–5.0)
HEMATOCRIT: 38.1 % (ref 36.0–46.0)
Hemoglobin: 12.1 g/dL (ref 12.0–15.0)
LYMPHS ABS: 2 10*3/uL (ref 0.7–4.0)
Lymphocytes Relative: 44.1 % (ref 12.0–46.0)
MCHC: 31.6 g/dL (ref 30.0–36.0)
MCV: 70.7 fl — ABNORMAL LOW (ref 78.0–100.0)
MONO ABS: 0.3 10*3/uL (ref 0.1–1.0)
Monocytes Relative: 6.3 % (ref 3.0–12.0)
NEUTROS ABS: 2 10*3/uL (ref 1.4–7.7)
NEUTROS PCT: 44.2 % (ref 43.0–77.0)
PLATELETS: 325 10*3/uL (ref 150.0–400.0)
RBC: 5.39 Mil/uL — ABNORMAL HIGH (ref 3.87–5.11)
RDW: 16.1 % — AB (ref 11.5–15.5)
WBC: 4.5 10*3/uL (ref 4.0–10.5)

## 2017-11-25 LAB — HEMOGLOBIN A1C: Hgb A1c MFr Bld: 5.4 % (ref 4.6–6.5)

## 2017-11-25 LAB — BASIC METABOLIC PANEL
BUN: 10 mg/dL (ref 6–23)
CHLORIDE: 101 meq/L (ref 96–112)
CO2: 26 mEq/L (ref 19–32)
Calcium: 9.7 mg/dL (ref 8.4–10.5)
Creatinine, Ser: 0.8 mg/dL (ref 0.40–1.20)
GFR: 114.24 mL/min (ref >60.00)
Glucose, Bld: 79 mg/dL (ref 70–99)
POTASSIUM: 3.9 meq/L (ref 3.5–5.1)
SODIUM: 136 meq/L (ref 135–145)

## 2017-11-25 LAB — VITAMIN D 25 HYDROXY (VIT D DEFICIENCY, FRACTURES): VITD: 21.84 ng/mL — AB (ref 30.00–100.00)

## 2017-11-25 LAB — TSH: TSH: 1.35 u[IU]/mL (ref 0.35–4.50)

## 2017-11-25 LAB — T4, FREE: FREE T4: 0.69 ng/dL (ref 0.60–1.60)

## 2017-11-25 NOTE — Progress Notes (Signed)
Subjective:     Marisa Fowler is a 22 y.o. female and is here for a comprehensive physical exam. The patient reports problems - fatigue.  Pt notes ongoing feeling of tiredness.  She also notes constipation and always being cold.  Pt was unsure if it was 2/2 her OCPs.  Pt denies depression, though states she did go to counseling ~4 yrs ago when her mother died.  Pt states one of her professors told her to take a semester off from school or do counseling.  Pt endorses good sleep and mood.  She is unsure if she really dealt with the death of her mother.  Pt is followed by [facility] Iz[facility].  Allergies: NKDA kiwi-lip edema Pineapple-throat itching  Social history: Patient is single.  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  She has done this for 3 years.  Patient is also a Consulting civil engineerstudent at [employer] where she has 6 more credits to finish before she can graduate.  Patient denies alcohol, tobacco, drug use.  Family medical history: Mom-deceased, breast cancer, early death, miscarriage Dad-alive Sister-Brittany, alive MGM-alive, diabetes, HLD, miscarriage MGF-deceased, alcohol abuse PGF-deceased, stroke  Dentist-Dr. Catha BrowScott Walrond Last eye exam April 2019 LMP 11/15/2017 Last Pap 10/28/2017 Social History   Socioeconomic History  . Marital status: Single    Spouse name: Not on file  . Number of children: Not on file  . Years of education: Not on file  . Highest education level: Not on file  Occupational History  . Not on file  Social Needs  . Financial resource strain: Not on file  . Food insecurity:    Worry: Not on file    Inability: Not on file  . Transportation needs:    Medical: Not on file    Non-medical: Not on file  Tobacco Use  . Smoking status: Never Smoker  . Smokeless tobacco: Never Used  Substance and Sexual Activity  . Alcohol use: Yes    Alcohol/week: 1.0 standard drinks    Types: 1 Shots  of liquor per week    Comment: socislly  . Drug use: No  . Sexual activity: Never  Lifestyle  . Physical activity:    Days per week: Not on file    Minutes per session: Not on file  . Stress: Not on file  Relationships  . Social connections:    Talks on phone: Not on file    Gets together: Not on file    Attends religious service: Not on file    Active member of club or organization: Not on file    Attends meetings of clubs or organizations: Not on file    Relationship status: Not on file  . Intimate partner violence:    Fear of current or ex partner: Not on file    Emotionally abused: Not on file    Physically abused: Not on file    Forced sexual activity: Not on file  Other Topics Concern  . Not on file  Social History Narrative  . Not on file   Health Maintenance  Topic Date Due  . HIV Screening  10/30/2009  . TETANUS/TDAP  10/30/2013  . INFLUENZA VACCINE  11/05/2017  . PAP SMEAR  10/28/2020    The following portions of the patient's history were reviewed and updated as appropriate: allergies, current medications, past family history, past medical history, past social history, past surgical history and problem list.  Review of Systems A comprehensive review of systems was negative.   Objective:    BP 110/78 (BP Location: Left Arm, Patient Position: Sitting, Cuff Size: Normal)   Pulse 86   Temp 98.8 F (37.1 C) (Oral)   Ht 5\' 3"  (1.6 m)   Wt 105 lb (47.6 kg)   LMP 11/15/2017 (Exact Date)   SpO2 99%   BMI 18.60 kg/m  General appearance: alert, cooperative and no distress Head: Normocephalic, without obvious abnormality, atraumatic Eyes: conjunctivae/corneas clear. PERRL, EOM's intact. Fundi benign. Ears: normal TM's and external ear canals both ears Nose: Nares normal. Septum midline. Mucosa normal. No drainage or sinus tenderness. Throat: lips, mucosa, and tongue normal; teeth and gums normal Neck: no adenopathy, no JVD, supple, symmetrical, trachea midline  and thyroid not enlarged, symmetric, no tenderness/mass/nodules Lungs: clear to auscultation bilaterally Heart: regular rate and rhythm, S1, S2 normal, no murmur, click, rub or gallop Abdomen: soft, non-tender; bowel sounds normal; no masses,  no organomegaly Extremities: extremities normal, atraumatic, no cyanosis or edema Skin: Skin color, texture, turgor normal. No rashes or lesions Neurologic: Alert and oriented X 3, normal strength and tone. Normal symmetric reflexes. Normal coordination and gait    Assessment:    Healthy female exam.      Plan:     Anticipatory guidance given including wearing seatbelts, smoke detectors in the home, increasing physical activity, increasing p.o. intake of water and vegetables. -will obtain labs incluiding CBC, BMP. hgb A1C, Vit D, TSH, T4 -pap up to date as followed by OB/Gyn See After Visit Summary for Counseling Recommendations    Fatigue -We will obtain labs including TSH, T4, vitamin D  Follow-up PRN  Abbe AmsterdamShannon Aryaman Haliburton, MD

## 2017-11-25 NOTE — Patient Instructions (Signed)
Preventive Care 18-39 Years, Female Preventive care refers to lifestyle choices and visits with your health care provider that can promote health and wellness. What does preventive care include?  A yearly physical exam. This is also called an annual well check.  Dental exams once or twice a year.  Routine eye exams. Ask your health care provider how often you should have your eyes checked.  Personal lifestyle choices, including: ? Daily care of your teeth and gums. ? Regular physical activity. ? Eating a healthy diet. ? Avoiding tobacco and drug use. ? Limiting alcohol use. ? Practicing safe sex. ? Taking vitamin and mineral supplements as recommended by your health care provider. What happens during an annual well check? The services and screenings done by your health care provider during your annual well check will depend on your age, overall health, lifestyle risk factors, and family history of disease. Counseling Your health care provider may ask you questions about your:  Alcohol use.  Tobacco use.  Drug use.  Emotional well-being.  Home and relationship well-being.  Sexual activity.  Eating habits.  Work and work Statistician.  Method of birth control.  Menstrual cycle.  Pregnancy history.  Screening You may have the following tests or measurements:  Height, weight, and BMI.  Diabetes screening. This is done by checking your blood sugar (glucose) after you have not eaten for a while (fasting).  Blood pressure.  Lipid and cholesterol levels. These may be checked every 5 years starting at age 66.  Skin check.  Hepatitis C blood test.  Hepatitis B blood test.  Sexually transmitted disease (STD) testing.  BRCA-related cancer screening. This may be done if you have a family history of breast, ovarian, tubal, or peritoneal cancers.  Pelvic exam and Pap test. This may be done every 3 years starting at age 40. Starting at age 59, this may be done every 5  years if you have a Pap test in combination with an HPV test.  Discuss your test results, treatment options, and if necessary, the need for more tests with your health care provider. Vaccines Your health care provider may recommend certain vaccines, such as:  Influenza vaccine. This is recommended every year.  Tetanus, diphtheria, and acellular pertussis (Tdap, Td) vaccine. You may need a Td booster every 10 years.  Varicella vaccine. You may need this if you have not been vaccinated.  HPV vaccine. If you are 69 or younger, you may need three doses over 6 months.  Measles, mumps, and rubella (MMR) vaccine. You may need at least one dose of MMR. You may also need a second dose.  Pneumococcal 13-valent conjugate (PCV13) vaccine. You may need this if you have certain conditions and were not previously vaccinated.  Pneumococcal polysaccharide (PPSV23) vaccine. You may need one or two doses if you smoke cigarettes or if you have certain conditions.  Meningococcal vaccine. One dose is recommended if you are age 27-21 years and a first-year college student living in a residence hall, or if you have one of several medical conditions. You may also need additional booster doses.  Hepatitis A vaccine. You may need this if you have certain conditions or if you travel or work in places where you may be exposed to hepatitis A.  Hepatitis B vaccine. You may need this if you have certain conditions or if you travel or work in places where you may be exposed to hepatitis B.  Haemophilus influenzae type b (Hib) vaccine. You may need this if  you have certain risk factors.  Talk to your health care provider about which screenings and vaccines you need and how often you need them. This information is not intended to replace advice given to you by your health care provider. Make sure you discuss any questions you have with your health care provider. Document Released: 05/20/2001 Document Revised: 12/12/2015  Document Reviewed: 01/23/2015 Elsevier Interactive Patient Education  2018 [employer].  Fatigue Fatigue is feeling tired all of the time, a lack of energy, or a lack of motivation. Occasional or mild fatigue is often a normal response to activity or life in general. However, long-lasting (chronic) or extreme fatigue may indicate an underlying medical condition. Follow these instructions at home: Watch your fatigue for any changes. The following actions may help to lessen any discomfort you are feeling:  Talk to your health care provider about how much sleep you need each night. Try to get the required amount every night.  Take medicines only as directed by your health care provider.  Eat a healthy and nutritious diet. Ask your health care provider if you need help changing your diet.  Drink enough fluid to keep your urine clear or pale yellow.  Practice ways of relaxing, such as yoga, meditation, massage therapy, or acupuncture.  Exercise regularly.  Change situations that cause you stress. Try to keep your work and personal routine reasonable.  Do not abuse illegal drugs.  Limit alcohol intake to no more than 1 drink per day for nonpregnant women and 2 drinks per day for men. One drink equals 12 ounces of beer, 5 ounces of wine, or 1 ounces of hard liquor.  Take a multivitamin, if directed by your health care provider.  Contact a health care provider if:  Your fatigue does not get better.  You have a fever.  You have unintentional weight loss or gain.  You have headaches.  You have difficulty: ? Falling asleep. ? Sleeping throughout the night.  You feel angry, guilty, anxious, or sad.  You are unable to have a bowel movement (constipation).  You skin is dry.  Your legs or another part of your body is swollen. Get help right away if:  You feel confused.  Your vision is blurry.  You feel faint or pass out.  You have a severe headache.  You have severe  abdominal, pelvic, or back pain.  You have chest pain, shortness of breath, or an irregular or fast heartbeat.  You are unable to urinate or you urinate less than normal.  You develop abnormal bleeding, such as bleeding from the rectum, vagina, nose, lungs, or nipples.  You vomit blood.  You have thoughts about harming yourself or committing suicide.  You are worried that you might harm someone else. This information is not intended to replace advice given to you by your health care provider. Make sure you discuss any questions you have with your health care provider. Document Released: 01/19/2007 Document Revised: 08/30/2015 Document Reviewed: 07/26/2013 Elsevier Interactive Patient Education  [employer].

## 2017-11-26 ENCOUNTER — Telehealth: Payer: Self-pay | Admitting: Family Medicine

## 2017-11-26 ENCOUNTER — Other Ambulatory Visit: Payer: Self-pay | Admitting: Family Medicine

## 2017-11-26 DIAGNOSIS — E559 Vitamin D deficiency, unspecified: Secondary | ICD-10-CM

## 2017-11-26 MED ORDER — VITAMIN D (ERGOCALCIFEROL) 1.25 MG (50000 UNIT) PO CAPS: 50000.0000 [IU] | ORAL_CAPSULE | ORAL | 0 refills | Status: DC

## 2017-11-26 NOTE — Telephone Encounter (Signed)
Copied from CRM [phone]. Topic: General - Other >> Nov 26, 2017 10:18 AM Darletta MollLander, Lumin L wrote: Reason for CRM: Patient calling for lab results

## 2017-11-26 NOTE — Telephone Encounter (Signed)
I do not see that these have been resulted yet. Please advise.

## 2017-11-27 NOTE — Telephone Encounter (Signed)
Pt was given her la results, voiced understanding

## 2018-02-15 ENCOUNTER — Other Ambulatory Visit: Payer: Self-pay | Admitting: Family Medicine

## 2018-02-15 DIAGNOSIS — E559 Vitamin D deficiency, unspecified: Secondary | ICD-10-CM

## 2018-04-08 IMAGING — CT CT ABD-PELV W/ CM
2 of 4 series · 16 of 46 positions shown, 18 images · IV contrast (APPLIED)
Comparison: None.

CLINICAL DATA: Lower right-sided pain for 3 days. Fevers
constipation. Nausea.

EXAM:
CT ABDOMEN AND PELVIS WITH CONTRAST
TECHNIQUE: Multidetector CT imaging of the abdomen and pelvis was performed
using the standard protocol following bolus administration of
intravenous contrast.
CONTRAST:  100mL KQSNU7-3YY IOPAMIDOL (KQSNU7-3YY) INJECTION 61%

[Series 3: abdomen 5.0 · axial · 0.54mm/px · z∈[+868,+1243]mm · 13 of 83 slices shown, 15 images]
[im 4/83  soft-tissue]
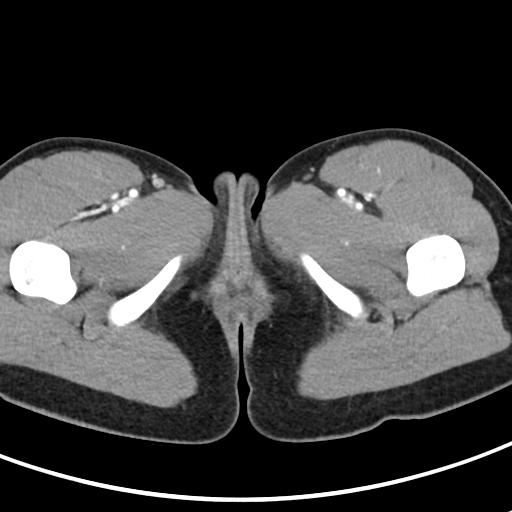
[im 4/83  bone]
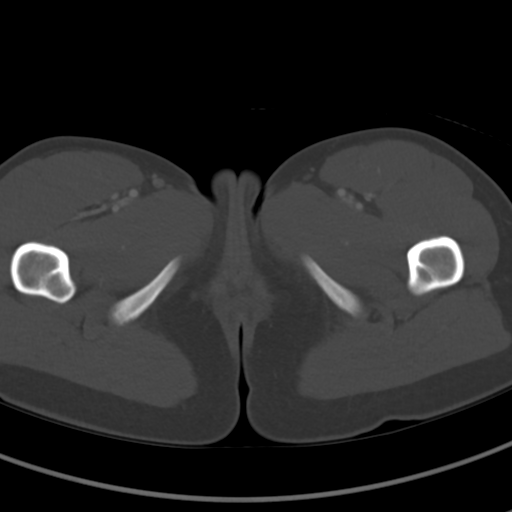
[im 12/83  soft-tissue]
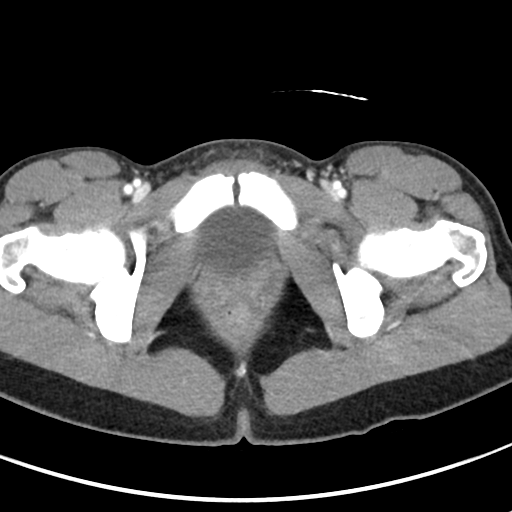
[im 19/83  soft-tissue]
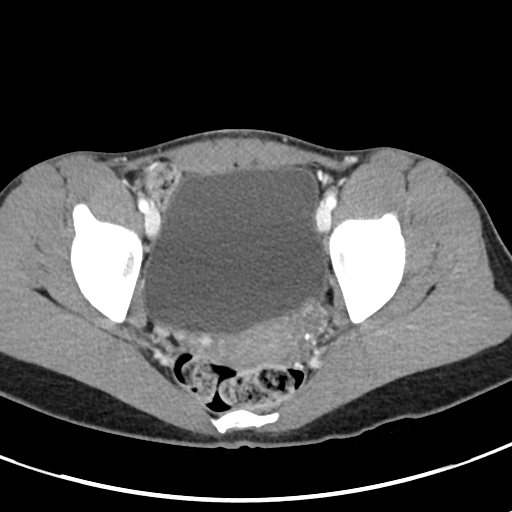
[im 23/83  soft-tissue]
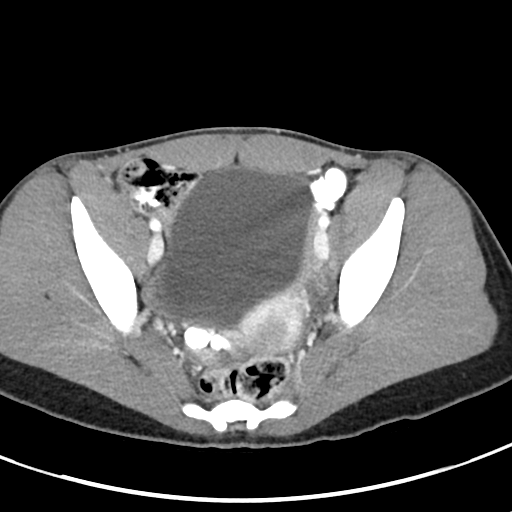
[im 30/83  soft-tissue]
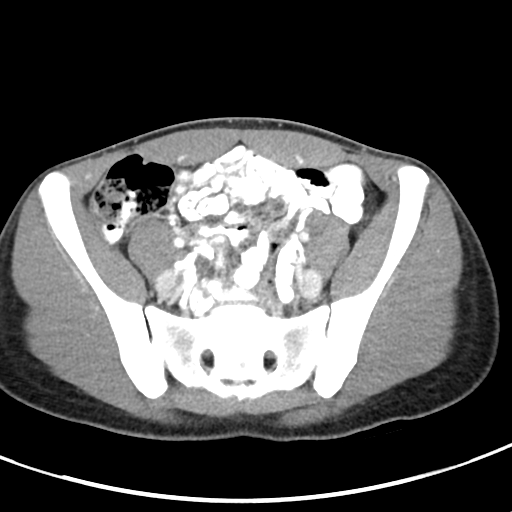
[im 34/83  soft-tissue]
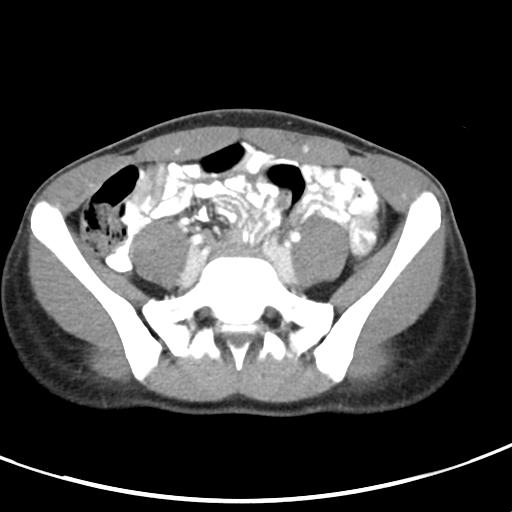
[im 42/83  soft-tissue]
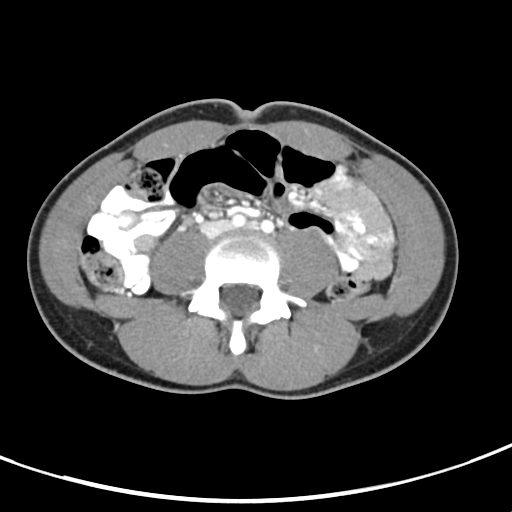
[im 49/83  soft-tissue]
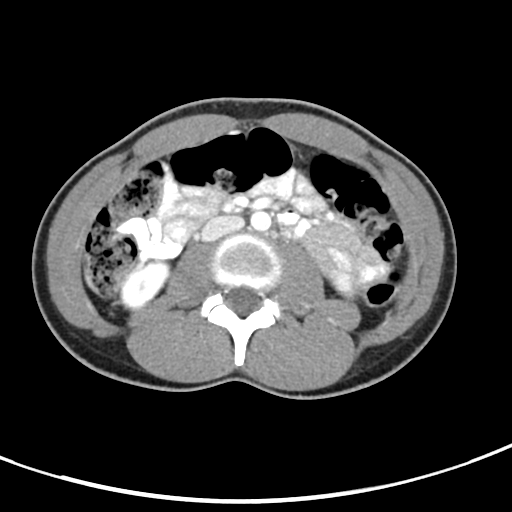
[im 53/83  soft-tissue]
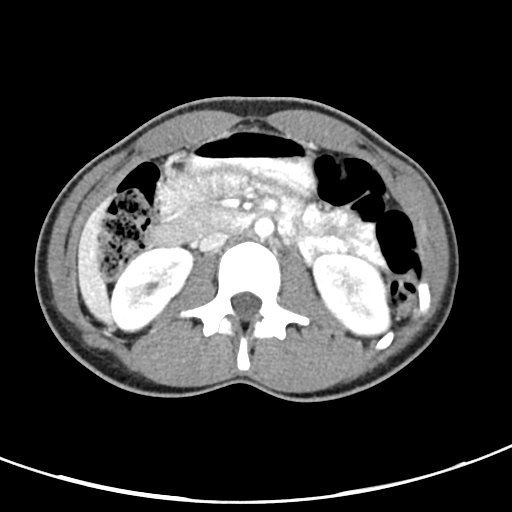
[im 53/83  bone]
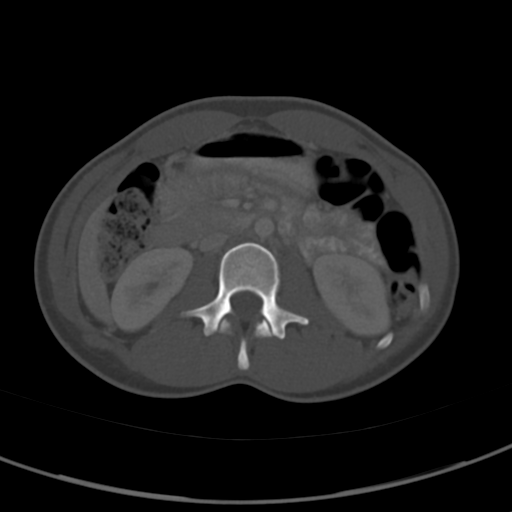
[im 60/83  soft-tissue]
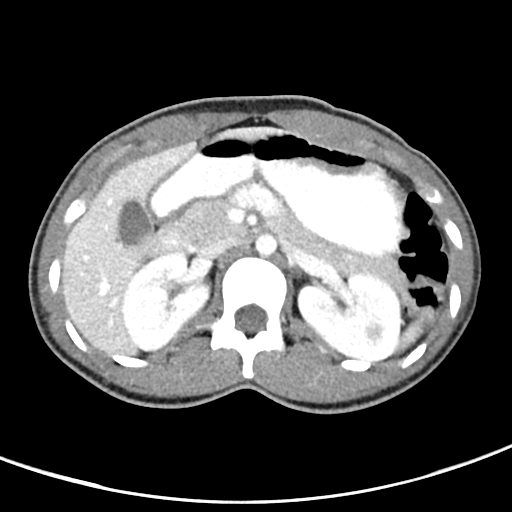
[im 64/83  soft-tissue]
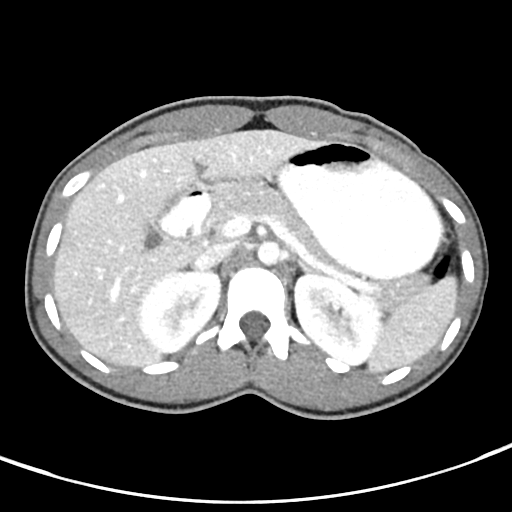
[im 71/83  soft-tissue]
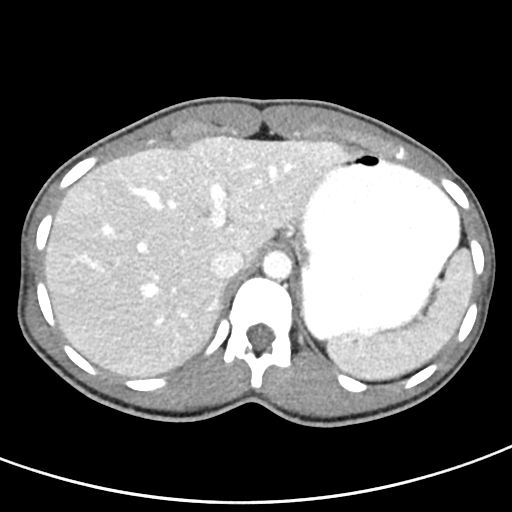
[im 79/83  soft-tissue]
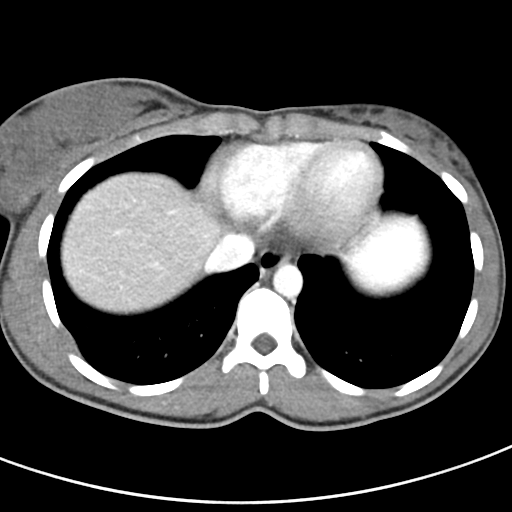

[Series 6: abdomen 3.0 mpr cor · coronal · 0.58mm/px · 3 of 60 slices shown]
[im 20/60  soft-tissue]
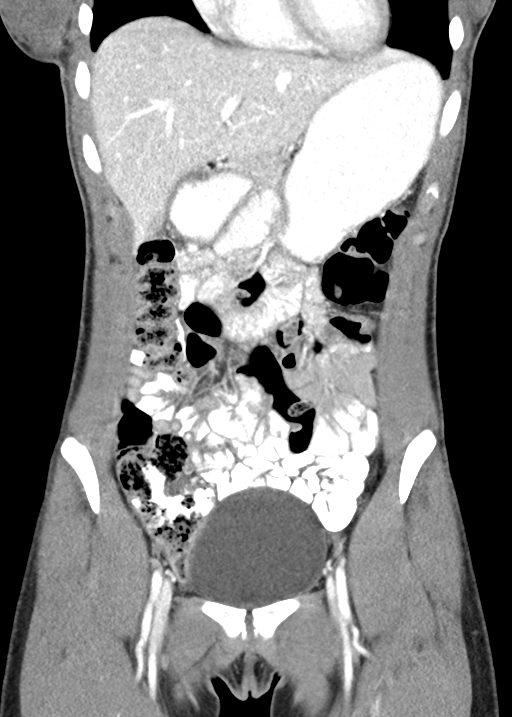
[im 27/60  soft-tissue]
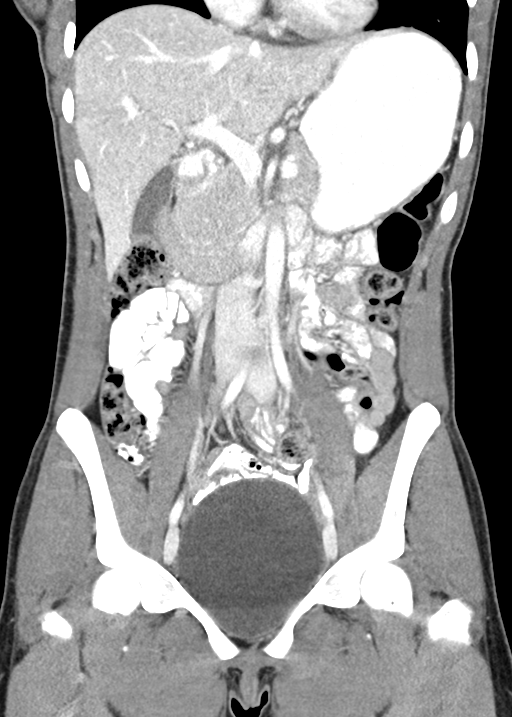
[im 33/60  soft-tissue]
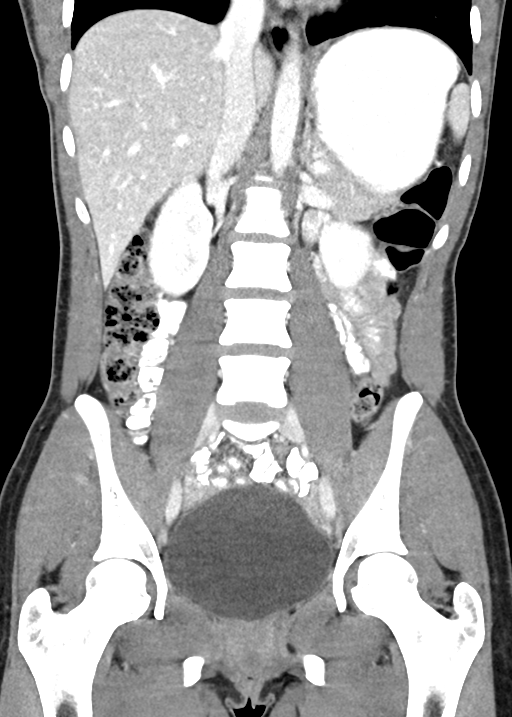

[16 of 46 positions shown; findings below may reference images not displayed]

FINDINGS: Lower chest: Lung bases are clear.

Hepatobiliary: No focal liver abnormality is seen. No gallstones,
gallbladder wall thickening, or biliary dilatation.

Pancreas: Unremarkable. No pancreatic ductal dilatation or
surrounding inflammatory changes.

Spleen: Normal in size without focal abnormality.

Adrenals/Urinary Tract: Adrenal glands are unremarkable. Kidneys are
normal, without renal calculi, focal lesion, or hydronephrosis.
Bladder is unremarkable.

Stomach/Bowel: Stomach is within normal limits. Appendix is not
identified. No evidence of bowel wall thickening, distention, or
inflammatory changes.

Vascular/Lymphatic: No significant vascular findings are present. No
enlarged abdominal or pelvic lymph nodes.

Reproductive: Uterus and bilateral adnexa are unremarkable.

Other: No abdominal wall hernia or abnormality. No abdominopelvic
ascites.

Musculoskeletal: No acute or significant osseous findings.
IMPRESSION: No acute process demonstrated in the abdomen or pelvis. No evidence
of bowel obstruction or inflammation. Appendix is not identified.

## 2018-05-04 ENCOUNTER — Encounter: Payer: Self-pay | Admitting: Family Medicine

## 2018-05-04 ENCOUNTER — Ambulatory Visit: Payer: BLUE CROSS/BLUE SHIELD | Admitting: Family Medicine

## 2018-05-04 VITALS — BP 118/84 | HR 92 | Temp 98.7°F | Wt 106.0 lb

## 2018-05-04 DIAGNOSIS — R51 Headache: Secondary | ICD-10-CM

## 2018-05-04 DIAGNOSIS — R519 Headache, unspecified: Secondary | ICD-10-CM

## 2018-05-04 DIAGNOSIS — F329 Major depressive disorder, single episode, unspecified: Secondary | ICD-10-CM | POA: Diagnosis not present

## 2018-05-04 DIAGNOSIS — F419 Anxiety disorder, unspecified: Secondary | ICD-10-CM | POA: Diagnosis not present
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
## 2018-05-04 NOTE — Patient Instructions (Signed)
Generalized Anxiety Disorder, Adult Generalized anxiety disorder (GAD) is a mental health disorder. People with this condition constantly worry about everyday events. Unlike normal anxiety, worry related to GAD is not triggered by a specific event. These worries also do not fade or get better with time. GAD interferes with life functions, including relationships, work, and school. GAD can vary from mild to severe. People with severe GAD can have intense waves of anxiety with physical symptoms (panic attacks). What are the causes? The exact cause of GAD is not known. What increases the risk? This condition is more likely to develop in:  Women.  People who have a family history of anxiety disorders.  People who are very shy.  People who experience very stressful life events, such as the death of a loved one.  People who have a very stressful family environment. What are the signs or symptoms? People with GAD often worry excessively about many things in their lives, such as their health and family. They may also be overly concerned about:  Doing well at work.  Being on time.  Natural disasters.  Friendships. Physical symptoms of GAD include:  Fatigue.  Muscle tension or having muscle twitches.  Trembling or feeling shaky.  Being easily startled.  Feeling like your heart is pounding or racing.  Feeling out of breath or like you cannot take a deep breath.  Having trouble falling asleep or staying asleep.  Sweating.  Nausea, diarrhea, or irritable bowel syndrome (IBS).  Headaches.  Trouble concentrating or remembering facts.  Restlessness.  Irritability. How is this diagnosed? Your health care provider can diagnose GAD based on your symptoms and medical history. You will also have a physical exam. The health care provider will ask specific questions about your symptoms, including how severe they are, when they started, and if they come and go. Your health care  provider may ask you about your use of alcohol or drugs, including prescription medicines. Your health care provider may refer you to a mental health specialist for further evaluation. Your health care provider will do a thorough examination and may perform additional tests to rule out other possible causes of your symptoms. To be diagnosed with GAD, a person must have anxiety that:  Is out of his or her control.  Affects several different aspects of his or her life, such as work and relationships.  Causes distress that makes him or her unable to take part in normal activities.  Includes at least three physical symptoms of GAD, such as restlessness, fatigue, trouble concentrating, irritability, muscle tension, or sleep problems. Before your health care provider can confirm a diagnosis of GAD, these symptoms must be present more days than they are not, and they must last for six months or longer. How is this treated? The following therapies are usually used to treat GAD:  Medicine. Antidepressant medicine is usually prescribed for long-term daily control. Antianxiety medicines may be added in severe cases, especially when panic attacks occur.  Talk therapy (psychotherapy). Certain types of talk therapy can be helpful in treating GAD by providing support, education, and guidance. Options include: ? Cognitive behavioral therapy (CBT). People learn coping skills and techniques to ease their anxiety. They learn to identify unrealistic or negative thoughts and behaviors and to replace them with positive ones. ? Acceptance and commitment therapy (ACT). This treatment teaches people how to be mindful as a way to cope with unwanted thoughts and feelings. ? Biofeedback. This process trains you to manage your body's response (  physiological response) through breathing techniques and relaxation methods. You will work with a therapist while machines are used to monitor your physical symptoms.  Stress  management techniques. These include yoga, meditation, and exercise. A mental health specialist can help determine which treatment is best for you. Some people see improvement with one type of therapy. However, other people require a combination of therapies. Follow these instructions at home:  Take over-the-counter and prescription medicines only as told by your health care provider.  Try to maintain a normal routine.  Try to anticipate stressful situations and allow extra time to manage them.  Practice any stress management or self-calming techniques as taught by your health care provider.  Do not punish yourself for setbacks or for not making progress.  Try to recognize your accomplishments, even if they are small.  Keep all follow-up visits as told by your health care provider. This is important. Contact a health care provider if:  Your symptoms do not get better.  Your symptoms get worse.  You have signs of depression, such as: ? A persistently sad, cranky, or irritable mood. ? Loss of enjoyment in activities that used to bring you joy. ? Change in weight or eating. ? Changes in sleeping habits. ? Avoiding friends or family members. ? Loss of energy for normal tasks. ? Feelings of guilt or worthlessness. Get help right away if:  You have serious thoughts about hurting yourself or others. If you ever feel like you may hurt yourself or others, or have thoughts about taking your own life, get help right away. You can go to your nearest emergency department or call:  Your local emergency services (911 in the [place]  A suicide crisis helpline, such as the National Suicide Prevention Lifeline at (316)277-1874. This is open 24 hours a day. Summary  Generalized anxiety disorder (GAD) is a mental health disorder that involves worry that is not triggered by a specific event.  People with GAD often worry excessively about many things in their lives, such as their health and  family.  GAD may cause physical symptoms such as restlessness, trouble concentrating, sleep problems, frequent sweating, nausea, diarrhea, headaches, and trembling or muscle twitching.  A mental health specialist can help determine which treatment is best for you. Some people see improvement with one type of therapy. However, other people require a combination of therapies. This information is not intended to replace advice given to you by your health care provider. Make sure you discuss any questions you have with your health care provider. Document Released: 07/19/2012 Document Revised: 02/12/2016 Document Reviewed: 02/12/2016 Elsevier Interactive Patient Education  2019 [employer]  Living With Anxiety  After being diagnosed with an anxiety disorder, you may be relieved to know why you have felt or behaved a certain way. It is natural to also feel overwhelmed about the treatment ahead and what it will mean for your life. With care and support, you can manage this condition and recover from it. How to cope with anxiety Dealing with stress Stress is your body's reaction to life changes and events, both good and bad. Stress can last just a few hours or it can be ongoing. Stress can play a major role in anxiety, so it is important to learn both how to cope with stress and how to think about it differently. Talk with your health care provider or a counselor to learn more about stress reduction. He or she may suggest some stress reduction techniques, such as:  Music  therapy. This can include creating or listening to music that you enjoy and that inspires you.  Mindfulness-based meditation. This involves being aware of your normal breaths, rather than trying to control your breathing. It can be done while sitting or walking.  Centering prayer. This is a kind of meditation that involves focusing on a word, phrase, or sacred image that is meaningful to you and that brings you peace.  Deep  breathing. To do this, expand your stomach and inhale slowly through your nose. Hold your breath for 3-5 seconds. Then exhale slowly, allowing your stomach muscles to relax.  Self-talk. This is a skill where you identify thought patterns that lead to anxiety reactions and correct those thoughts.  Muscle relaxation. This involves tensing muscles then relaxing them. Choose a stress reduction technique that fits your lifestyle and personality. Stress reduction techniques take time and practice. Set aside 5-15 minutes a day to do them. Therapists can offer training in these techniques. The training may be covered by some insurance plans. Other things you can do to manage stress include:  Keeping a stress diary. This can help you learn what triggers your stress and ways to control your response.  Thinking about how you respond to certain situations. You may not be able to control everything, but you can control your reaction.  Making time for activities that help you relax, and not feeling guilty about spending your time in this way. Therapy combined with coping and stress-reduction skills provides the best chance for successful treatment. Medicines Medicines can help ease symptoms. Medicines for anxiety include:  Anti-anxiety drugs.  Antidepressants.  Beta-blockers. Medicines may be used as the main treatment for anxiety disorder, along with therapy, or if other treatments are not working. Medicines should be prescribed by a health care provider. Relationships Relationships can play a big part in helping you recover. Try to spend more time connecting with trusted friends and family members. Consider going to couples counseling, taking family education classes, or going to family therapy. Therapy can help you and others better understand the condition. How to recognize changes in your condition Everyone has a different response to treatment for anxiety. Recovery from anxiety happens when symptoms  decrease and stop interfering with your daily activities at home or work. This may mean that you will start to:  Have better concentration and focus.  Sleep better.  Be less irritable.  Have more energy.  Have improved memory. It is important to recognize when your condition is getting worse. Contact your health care provider if your symptoms interfere with home or work and you do not feel like your condition is improving. Where to find help and support: You can get help and support from these sources:  Self-help groups.  Online and [employer] organizations.  A trusted spiritual leader.  Couples counseling.  Family education classes.  Family therapy. Follow these instructions at home:  Eat a healthy diet that includes plenty of vegetables, fruits, whole grains, low-fat dairy products, and lean protein. Do not eat a lot of foods that are high in solid fats, added sugars, or salt.  Exercise. Most adults should do the following: ? Exercise for at least 150 minutes each week. The exercise should increase your heart rate and make you sweat (moderate-intensity exercise). ? Strengthening exercises at least twice a week.  Cut down on caffeine, tobacco, alcohol, and other potentially harmful substances.  Get the right amount and quality of sleep. Most adults need 7-9 hours of sleep each night.  Make choices that simplify your life.  Take over-the-counter and prescription medicines only as told by your health care provider.  Avoid caffeine, alcohol, and certain over-the-counter cold medicines. These may make you feel worse. Ask your pharmacist which medicines to avoid.  Keep all follow-up visits as told by your health care provider. This is important. Questions to ask your health care provider  Would I benefit from therapy?  How often should I follow up with a health care provider?  How long do I need to take medicine?  Are there any long-term side effects of my  medicine?  Are there any alternatives to taking medicine? Contact a health care provider if:  You have a hard time staying focused or finishing daily tasks.  You spend many hours a day feeling worried about everyday life.  You become exhausted by worry.  You start to have headaches, feel tense, or have nausea.  You urinate more than normal.  You have diarrhea. Get help right away if:  You have a racing heart and shortness of breath.  You have thoughts of hurting yourself or others. If you ever feel like you may hurt yourself or others, or have thoughts about taking your own life, get help right away. You can go to your nearest emergency department or call:  Your local emergency services (911 in the [place]  A suicide crisis helpline, such as the National Suicide Prevention Lifeline at 281-356-6049. This is open 24-hours a day. Summary  Taking steps to deal with stress can help calm you.  Medicines cannot cure anxiety disorders, but they can help ease symptoms.  Family, friends, and partners can play a big part in helping you recover from an anxiety disorder. This information is not intended to replace advice given to you by your health care provider. Make sure you discuss any questions you have with your health care provider. Document Released: 03/18/2016 Document Revised: 03/18/2016 Document Reviewed: 03/18/2016 Elsevier Interactive Patient Education  2019 [employer]  Living With Depression Everyone experiences occasional disappointment, sadness, and loss in their lives. When you are feeling down, blue, or sad for at least 2 weeks in a row, it may mean that you have depression. Depression can affect your thoughts and feelings, relationships, daily activities, and physical health. It is caused by changes in the way your brain functions. If you receive a diagnosis of depression, your health care provider will tell you which type of depression you have and what treatment  options are available to you. If you are living with depression, there are ways to help you recover from it and also ways to prevent it from coming back. How to cope with lifestyle changes Coping with stress     Stress is your body's reaction to life changes and events, both good and bad. Stressful situations may include:  Getting married.  The death of a spouse.  Losing a job.  Retiring.  Having a baby. Stress can last just a few hours or it can be ongoing. Stress can play a major role in depression, so it is important to learn both how to cope with stress and how to think about it differently. Talk with your health care provider or a counselor if you would like to learn more about stress reduction. He or she may suggest some stress reduction techniques, such as:  Music therapy. This can include creating music or listening to music. Choose music that you enjoy and that inspires you.  Mindfulness-based meditation. This kind  of meditation can be done while sitting or walking. It involves being aware of your normal breaths, rather than trying to control your breathing.  Centering prayer. This is a kind of meditation that involves focusing on a spiritual word or phrase. Choose a word, phrase, or sacred image that is meaningful to you and that brings you peace.  Deep breathing. To do this, expand your stomach and inhale slowly through your nose. Hold your breath for 3-5 seconds, then exhale slowly, allowing your stomach muscles to relax.  Muscle relaxation. This involves intentionally tensing muscles then relaxing them. Choose a stress reduction technique that fits your lifestyle and personality. Stress reduction techniques take time and practice to develop. Set aside 5-15 minutes a day to do them. Therapists can offer training in these techniques. The training may be covered by some insurance plans. Other things you can do to manage stress include:  Keeping a stress diary. This can help  you learn what triggers your stress and ways to control your response.  Understanding what your limits are and saying no to requests or events that lead to a schedule that is too full.  Thinking about how you respond to certain situations. You may not be able to control everything, but you can control how you react.  Adding humor to your life by watching funny films or TV shows.  Making time for activities that help you relax and not feeling guilty about spending your time this way.  Medicines Your health care provider may suggest certain medicines if he or she feels that they will help improve your condition. Avoid using alcohol and other substances that may prevent your medicines from working properly (may interact). It is also important to:  Talk with your pharmacist or health care provider about all the medicines that you take, their possible side effects, and what medicines are safe to take together.  Make it your goal to take part in all treatment decisions (shared decision-making). This includes giving input on the side effects of medicines. It is best if shared decision-making with your health care provider is part of your total treatment plan. If your health care provider prescribes a medicine, you may not notice the full benefits of it for 4-8 weeks. Most people who are treated for depression need to be on medicine for at least 6-12 months after they feel better. If you are taking medicines as part of your treatment, do not stop taking medicines without first talking to your health care provider. You may need to have the medicine slowly decreased (tapered) over time to decrease the risk of harmful side effects. Relationships Your health care provider may suggest family therapy along with individual therapy and drug therapy. While there may not be family problems that are causing you to feel depressed, it is still important to make sure your family learns as much as they can about your  mental health. Having your family's support can help make your treatment successful. How to recognize changes in your condition Everyone has a different response to treatment for depression. Recovery from major depression happens when you have not had signs of major depression for two months. This may mean that you will start to:  Have more interest in doing activities.  Feel less hopeless than you did 2 months ago.  Have more energy.  Overeat less often, or have better or improving appetite.  Have better concentration. Your health care provider will work with you to decide the next steps in  your recovery. It is also important to recognize when your condition is getting worse. Watch for these signs:  Having fatigue or low energy.  Eating too much or too little.  Sleeping too much or too little.  Feeling restless, agitated, or hopeless.  Having trouble concentrating or making decisions.  Having unexplained physical complaints.  Feeling irritable, angry, or aggressive. Get help as soon as you or your family members notice these symptoms coming back. How to get support and help from others How to talk with friends and family members about your condition  Talking to friends and family members about your condition can provide you with one way to get support and guidance. Reach out to trusted friends or family members, explain your symptoms to them, and let them know that you are working with a health care provider to treat your depression. Financial resources Not all insurance plans cover mental health care, so it is important to check with your insurance carrier. If paying for co-pays or counseling services is a problem, search for a local or county mental health care center. They may be able to offer public mental health care services at low or no cost when you are not able to see a private health care provider. If you are taking medicine for depression, you may be able to get the  generic form, which may be less expensive. Some makers of prescription medicines also offer help to patients who cannot afford the medicines they need. Follow these instructions at home:   Get the right amount and quality of sleep.  Cut down on using caffeine, tobacco, alcohol, and other potentially harmful substances.  Try to exercise, such as walking or lifting small weights.  Take over-the-counter and prescription medicines only as told by your health care provider.  Eat a healthy diet that includes plenty of vegetables, fruits, whole grains, low-fat dairy products, and lean protein. Do not eat a lot of foods that are high in solid fats, added sugars, or salt.  Keep all follow-up visits as told by your health care provider. This is important. Contact a health care provider if:  You stop taking your antidepressant medicines, and you have any of these symptoms: ? Nausea. ? Headache. ? Feeling lightheaded. ? Chills and body aches. ? Not being able to sleep (insomnia).  You or your friends and family think your depression is getting worse. Get help right away if:  You have thoughts of hurting yourself or others. If you ever feel like you may hurt yourself or others, or have thoughts about taking your own life, get help right away. You can go to your nearest emergency department or call:  Your local emergency services (911 in the [place]  A suicide crisis helpline, such as the National Suicide Prevention Lifeline at 727-651-04011-847-393-0756. This is open 24-hours a day. Summary  If you are living with depression, there are ways to help you recover from it and also ways to prevent it from coming back.  Work with your health care team to create a management plan that includes counseling, stress management techniques, and healthy lifestyle habits. This information is not intended to replace advice given to you by your health care provider. Make sure you discuss any questions you have with your  health care provider. Document Released: 02/25/2016 Document Revised: 02/25/2016 Document Reviewed: 02/25/2016 Elsevier Interactive Patient Education  2019 [employer]

## 2018-05-21 ENCOUNTER — Encounter: Payer: Self-pay | Admitting: Emergency Medicine

## 2018-05-21 ENCOUNTER — Ambulatory Visit
Admission: EM | Admit: 2018-05-21 | Discharge: 2018-05-21 | Disposition: A | Discharge status: Home / Self Care | Payer: BLUE CROSS/BLUE SHIELD | Attending: Physician Assistant | Admitting: Physician Assistant

## 2018-05-21 DIAGNOSIS — B349 Viral infection, unspecified: Secondary | ICD-10-CM

## 2018-05-21 MED ORDER — HYDROCODONE-HOMATROPINE 5-1.5 MG/5ML PO SYRP: 5.0000 mL | ORAL_SOLUTION | Freq: Four times a day (QID) | ORAL | 0 refills | Status: DC | PRN

## 2018-05-21 MED ORDER — BENZONATATE 200 MG PO CAPS: 200.0000 mg | ORAL_CAPSULE | Freq: Three times a day (TID) | ORAL | 0 refills | Status: AC

## 2018-05-21 NOTE — Discharge Instructions (Signed)
Tessalon for cough. Hycodan as needed at night. Start flonase, zyrtec for nasal congestion/drainage. You can use over the counter nasal saline rinse such as neti pot for nasal congestion. Keep hydrated, your urine should be clear to pale yellow in color. Tylenol/motrin for fever and pain. Monitor for any worsening of symptoms, chest pain, shortness of breath, wheezing, swelling of the throat, follow up for reevaluation.   For sore throat/cough try using a honey-based tea. Use 3 teaspoons of honey with juice squeezed from half lemon. Place shaved pieces of ginger into 1/2-1 cup of water and warm over stove top. Then mix the ingredients and repeat every 4 hours as needed.
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
## 2018-05-21 NOTE — ED Notes (Signed)
Patient able to ambulate independently  

## 2018-05-21 NOTE — ED Triage Notes (Signed)
Pt presents to [facility] for assessment of 5 days of cough, congestion, sneezing.  States 2 days ago she began to have central chest pain, constant, no radiation, c/o SOB with exertion, denies light-headedness, denies n/v.

## 2018-06-23 ENCOUNTER — Encounter: Payer: Self-pay | Admitting: Family Medicine

## 2018-09-08 ENCOUNTER — Encounter: Payer: Self-pay | Admitting: Family Medicine

## 2019-01-15 ENCOUNTER — Encounter: Payer: Self-pay | Admitting: Family Medicine

## 2019-01-19 ENCOUNTER — Other Ambulatory Visit: Payer: Self-pay

## 2019-01-19 DIAGNOSIS — R519 Headache, unspecified: Secondary | ICD-10-CM

## 2019-01-29 ENCOUNTER — Encounter: Payer: Self-pay | Admitting: Family Medicine
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
## 2019-02-01 ENCOUNTER — Other Ambulatory Visit: Payer: Self-pay

## 2019-02-01 ENCOUNTER — Ambulatory Visit: Payer: BC Managed Care – PPO | Admitting: Neurology

## 2019-02-01 ENCOUNTER — Encounter: Payer: Self-pay | Admitting: Neurology

## 2019-02-01 VITALS — BP 112/80 | HR 96 | Temp 97.8°F | Ht 62.0 in | Wt 109.0 lb

## 2019-02-01 DIAGNOSIS — G441 Vascular headache, not elsewhere classified: Secondary | ICD-10-CM

## 2019-02-01 DIAGNOSIS — R200 Anesthesia of skin: Secondary | ICD-10-CM

## 2019-02-01 DIAGNOSIS — G44229 Chronic tension-type headache, not intractable: Secondary | ICD-10-CM | POA: Diagnosis not present

## 2019-02-01 DIAGNOSIS — G8929 Other chronic pain: Secondary | ICD-10-CM

## 2019-02-01 DIAGNOSIS — R519 Headache, unspecified: Secondary | ICD-10-CM

## 2019-02-01 MED ORDER — NORTRIPTYLINE HCL 10 MG PO CAPS: 10.0000 mg | ORAL_CAPSULE | Freq: Every day | ORAL | 3 refills | Status: DC

## 2019-02-01 NOTE — Patient Instructions (Addendum)
MRI of the brain Nortriptyline at bedtime for headache and insomnia   Tension Headache, Adult A tension headache is a feeling of pain, pressure, or aching in the head that is often felt over the front and sides of the head. The pain can be dull, or it can feel tight (constricting). There are two types of tension headache:  Episodic tension headache. This is when the headaches happen fewer than 15 days a month.  Chronic tension headache. This is when the headaches happen more than 15 days a month during a 34-month period. A tension headache can last from 30 minutes to several days. It is the most common kind of headache. Tension headaches are not normally associated with nausea or vomiting, and they do not get worse with physical activity. What are the causes? The exact cause of this condition is not known. Tension headaches are often triggered by stress, anxiety, or depression. Other triggers include:  Alcohol.  Too much caffeine or caffeine withdrawal.  Respiratory infections, such as colds, flu, or sinus infections.  Dental problems or teeth clenching.  Tiredness (fatigue).  Holding your head and neck in the same position for a long period of time, such as while using a computer.  Smoking.  Arthritis of the neck. What are the signs or symptoms? Symptoms of this condition include:  A feeling of pressure or tightness around the head.  Dull, aching head pain.  Pain over the front and sides of the head.  Tenderness in the muscles of the head, neck, and shoulders. How is this diagnosed? This condition may be diagnosed based on your symptoms, your medical history, and a physical exam. If your symptoms are severe or unusual, you may have imaging tests, such as a CT scan or an MRI of your head. Your vision may also be checked. How is this treated? This condition may be treated with lifestyle changes and with medicines that help relieve symptoms. Follow these instructions at  home: Managing pain  Take over-the-counter and prescription medicines only as told by your health care provider.  When you have a headache, lie down in a dark, quiet room.  If directed, apply ice to the head and neck: ? Put ice in a plastic bag. ? Place a towel between your skin and the bag. ? Leave the ice on for 20 minutes, 2-3 times a day.  If directed, apply heat to the back of your neck as often as told by your health care provider. Use the heat source that your health care provider recommends, such as a moist heat pack or a heating pad. ? Place a towel between your skin and the heat source. ? Leave the heat on for 20-30 minutes. ? Remove the heat if your skin turns bright red. This is especially important if you are unable to feel pain, heat, or cold. You may have a greater risk of getting burned. Eating and drinking  Eat meals on a regular schedule.  Limit alcohol intake to no more than 1 drink a day for nonpregnant women and 2 drinks a day for men. One drink equals 12 oz of beer, 5 oz of wine, or 1 oz of hard liquor.  Drink enough fluid to keep your urine pale yellow.  Decrease your caffeine intake, or stop using caffeine. Lifestyle  Get 7-9 hours of sleep each night, or get the amount of sleep recommended by your health care provider.  At bedtime, remove all electronic devices from your room. Electronic devices include  computers, phones, and tablets.  Find ways to manage your stress. Some things that can help relieve stress include: ? Exercise. ? Deep breathing exercises. ? Yoga. ? Listening to music. ? Positive mental imagery.  Try to sit up straight and avoid tensing your muscles.  Do not use any products that contain nicotine or tobacco, such as cigarettes and e-cigarettes. If you need help quitting, ask your health care provider. General instructions   Keep all follow-up visits as told by your health care provider. This is important.  Avoid any headache  triggers. Keep a headache journal to help find out what may trigger your headaches. For example, write down: ? What you eat and drink. ? How much sleep you get. ? Any change to your diet or medicines. Contact a health care provider if:  Your headache does not get better.  Your headache comes back.  You are sensitive to sounds, light, or smells because of a headache.  You have nausea or you vomit.  Your stomach hurts. Get help right away if:  You suddenly develop a very severe headache along with any of the following: ? A stiff neck. ? Nausea and vomiting. ? Confusion. ? Weakness. ? Double vision or loss of vision. ? Shortness of breath. ? Rash. ? Unusual sleepiness. ? Fever. ? Trouble speaking. ? Pain in your eyes or ears. ? Trouble walking or balancing. ? Feeling faint or passing out. Summary  A tension headache is a feeling of pain, pressure, or aching in the head that is often felt over the front and sides of the head.  A tension headache can last from 30 minutes to several days. It is the most common kind of headache.  This condition may be diagnosed based on your symptoms, your medical history, and a physical exam.  This condition may be treated with lifestyle changes and with medicines that help relieve symptoms. This information is not intended to replace advice given to you by your health care provider. Make sure you discuss any questions you have with your health care provider. Document Released: 03/24/2005 Document Revised: 03/06/2017 Document Reviewed: 07/04/2016 Elsevier Patient Education  2020 [employer]   Nortriptyline capsules What is this medicine? NORTRIPTYLINE (nor TRIP ti leen) is used to treat  insomnia, headaches, depression. This medicine may be used for other purposes; ask your health care provider or pharmacist if you have questions. COMMON BRAND NAME(S): Aventyl, Pamelor What should I tell my health care provider before I take this  medicine? They need to know if you have any of these conditions:  bipolar disorder  Brugada syndrome  difficulty passing urine  glaucoma  heart disease  if you drink alcohol  liver disease  schizophrenia  seizures  suicidal thoughts, plans or attempt; a previous suicide attempt by you or a family member  thyroid disease  an unusual or allergic reaction to nortriptyline, other tricyclic antidepressants, other medicines, foods, dyes, or preservatives  pregnant or trying to get pregnant  breast-feeding How should I use this medicine? Take this medicine by mouth with a glass of water. Follow the directions on the prescription label. Take your doses at regular intervals. Do not take it more often than directed. Do not stop taking this medicine suddenly except upon the advice of your doctor. Stopping this medicine too quickly may cause serious side effects or your condition may worsen. A special MedGuide will be given to you by the pharmacist with each prescription and refill. Be sure to read this  information carefully each time. Talk to your pediatrician regarding the use of this medicine in children. Special care may be needed. Overdosage: If you think you have taken too much of this medicine contact a poison control center or emergency room at once. NOTE: This medicine is only for you. Do not share this medicine with others. What if I miss a dose? If you miss a dose, take it as soon as you can. If it is almost time for your next dose, take only that dose. Do not take double or extra doses. What may interact with this medicine? Do not take this medicine with any of the following medications:  cisapride  dronedarone  linezolid  MAOIs like Carbex, Eldepryl, Marplan, Nardil, and Parnate  methylene blue (injected into a vein)  pimozide  thioridazine This medicine may also interact with the following medications:  alcohol  antihistamines for allergy, cough, and  cold  atropine  certain medicines for bladder problems like oxybutynin, tolterodine  certain medicines for depression like amitriptyline, fluoxetine, sertraline  certain medicines for Parkinson's disease like benztropine, trihexyphenidyl  certain medicines for stomach problems like dicyclomine, hyoscyamine  certain medicines for travel sickness like scopolamine  chlorpropamide  cimetidine  ipratropium  other medicines that prolong the QT interval (an abnormal heart rhythm) like dofetilide  other medicines that can cause serotonin syndrome like St. John's Wort, fentanyl, lithium, tramadol, tryptophan, buspirone, and some medicines for headaches like sumatriptan or rizatriptan  quinidine  reserpine  thyroid medicine This list may not describe all possible interactions. Give your health care provider a list of all the medicines, herbs, non-prescription drugs, or dietary supplements you use. Also tell them if you smoke, drink alcohol, or use illegal drugs. Some items may interact with your medicine. What should I watch for while using this medicine? Tell your doctor if your symptoms do not get better or if they get worse. Visit your doctor or health care professional for regular checks on your progress. Because it may take several weeks to see the full effects of this medicine, it is important to continue your treatment as prescribed by your doctor. Patients and their families should watch out for new or worsening thoughts of suicide or depression. Also watch out for sudden changes in feelings such as feeling anxious, agitated, panicky, irritable, hostile, aggressive, impulsive, severely restless, overly excited and hyperactive, or not being able to sleep. If this happens, especially at the beginning of treatment or after a change in dose, call your health care professional. Bonita Quin may get drowsy or dizzy. Do not drive, use machinery, or do anything that needs mental alertness until you  know how this medicine affects you. Do not stand or sit up quickly, especially if you are an older patient. This reduces the risk of dizzy or fainting spells. Alcohol may interfere with the effect of this medicine. Avoid alcoholic drinks. Do not treat yourself for coughs, colds, or allergies without asking your doctor or health care professional for advice. Some ingredients can increase possible side effects. Your mouth may get dry. Chewing sugarless gum or sucking hard candy, and drinking plenty of water may help. Contact your doctor if the problem does not go away or is severe. This medicine may cause dry eyes and blurred vision. If you wear contact lenses you may feel some discomfort. Lubricating drops may help. See your eye doctor if the problem does not go away or is severe. This medicine can cause constipation. Try to have a bowel movement at  least every 2 to 3 days. If you do not have a bowel movement for 3 days, call your doctor or health care professional. This medicine can make you more sensitive to the sun. Keep out of the sun. If you cannot avoid being in the sun, wear protective clothing and use sunscreen. Do not use sun lamps or tanning beds/booths. What side effects may I notice from receiving this medicine? Side effects that you should report to your doctor or health care professional as soon as possible:  allergic reactions like skin rash, itching or hives, swelling of the face, lips, or tongue  anxious  breathing problems  changes in vision  confusion  elevated mood, decreased need for sleep, racing thoughts, impulsive behavior  eye pain  fast, irregular heartbeat  feeling faint or lightheaded, falls  feeling agitated, angry, or irritable  fever with increased sweating  hallucination, loss of contact with reality  seizures  stiff muscles  suicidal thoughts or other mood changes  tingling, pain, or numbness in the feet or hands  trouble passing urine or  change in the amount of urine  trouble sleeping  unusually weak or tired  vomiting  yellowing of the eyes or skin Side effects that usually do not require medical attention (report to your doctor or health care professional if they continue or are bothersome):  change in sex drive or performance  change in appetite or weight  constipation  dizziness  dry mouth  nausea  tired  tremors  upset stomach This list may not describe all possible side effects. Call your doctor for medical advice about side effects. You may report side effects to FDA at 1-800-FDA-1088. Where should I keep my medicine? Keep out of the reach of children. Store at room temperature between 15 and 30 degrees C (59 and 86 degrees F). Keep container tightly closed. Throw away any unused medicine after the expiration date. NOTE: This sheet is a summary. It may not cover all possible information. If you have questions about this medicine, talk to your doctor, pharmacist, or health care provider.  2020 Elsevier/Gold Standard (2018-03-16 13:24:58)

## 2019-02-03 ENCOUNTER — Telehealth: Payer: Self-pay | Admitting: Neurology

## 2019-02-03 NOTE — Telephone Encounter (Signed)
pt has [facility] faxed order they will reach out to the patient to schedule

## 2019-02-07 NOTE — Telephone Encounter (Signed)
Patient is scheduled at [facility] for 02/09/19 at 3:00 pm.

## 2019-02-16 ENCOUNTER — Encounter: Payer: Self-pay | Admitting: *Deleted

## 2019-02-24 ENCOUNTER — Other Ambulatory Visit: Payer: Self-pay | Admitting: Neurology

## 2019-10-12 ENCOUNTER — Encounter: Payer: Self-pay | Admitting: Family Medicine

## 2019-10-19 ENCOUNTER — Ambulatory Visit (INDEPENDENT_AMBULATORY_CARE_PROVIDER_SITE_OTHER): Payer: BC Managed Care – PPO | Admitting: Family Medicine

## 2019-10-19 ENCOUNTER — Encounter: Payer: Self-pay | Admitting: Family Medicine

## 2019-10-19 ENCOUNTER — Other Ambulatory Visit: Payer: Self-pay

## 2019-10-19 VITALS — BP 110/80 | Temp 98.8°F | Wt 114.8 lb

## 2019-10-19 DIAGNOSIS — Z0000 Encounter for general adult medical examination without abnormal findings: Secondary | ICD-10-CM

## 2019-10-19 DIAGNOSIS — E559 Vitamin D deficiency, unspecified: Secondary | ICD-10-CM

## 2019-10-19 DIAGNOSIS — Z7189 Other specified counseling: Secondary | ICD-10-CM | POA: Diagnosis not present

## 2019-10-19 DIAGNOSIS — B360 Pityriasis versicolor: Secondary | ICD-10-CM

## 2019-10-19 DIAGNOSIS — E049 Nontoxic goiter, unspecified: Secondary | ICD-10-CM

## 2019-10-19 NOTE — Patient Instructions (Addendum)
Health Maintenance Due  Topic Date Due  . Hepatitis C Screening  Never done  . COVID-19 Vaccine (1) Never done  . HIV Screening  Never done  . TETANUS/TDAP  Never done  . PAP-Cervical Cytology Screening  Never done    Depression screen [facility] 2/9 06/28/2016  Decreased Interest 0  Down, Depressed, Hopeless 0  PHQ - 2 Score 0   Preventive Care 76-25 Years Old, Female Preventive care refers to visits with your health care provider and lifestyle choices that can promote health and wellness. This includes:  A yearly physical exam. This may also be called an annual well check.  Regular dental visits and eye exams.  Immunizations.  Screening for certain conditions.  Healthy lifestyle choices, such as eating a healthy diet, getting regular exercise, not using drugs or products that contain nicotine and tobacco, and limiting alcohol use. What can I expect for my preventive care visit? Physical exam Your health care provider will check your:  Height and weight. This may be used to calculate body mass index (BMI), which tells if you are at a healthy weight.  Heart rate and blood pressure.  Skin for abnormal spots. Counseling Your health care provider may ask you questions about your:  Alcohol, tobacco, and drug use.  Emotional well-being.  Home and relationship well-being.  Sexual activity.  Eating habits.  Work and work Statistician.  Method of birth control.  Menstrual cycle.  Pregnancy history. What immunizations do I need?  Influenza (flu) vaccine  This is recommended every year. Tetanus, diphtheria, and pertussis (Tdap) vaccine  You may need a Td booster every 10 years. Varicella (chickenpox) vaccine  You may need this if you have not been vaccinated. Human papillomavirus (HPV) vaccine  If recommended by your health care provider, you may need three doses over 6 months. Measles, mumps, and rubella (MMR) vaccine  You may need at least one dose of MMR. You  may also need a second dose. Meningococcal conjugate (MenACWY) vaccine  One dose is recommended if you are age 43-21 years and a first-year college student living in a residence hall, or if you have one of several medical conditions. You may also need additional booster doses. Pneumococcal conjugate (PCV13) vaccine  You may need this if you have certain conditions and were not previously vaccinated. Pneumococcal polysaccharide (PPSV23) vaccine  You may need one or two doses if you smoke cigarettes or if you have certain conditions. Hepatitis A vaccine  You may need this if you have certain conditions or if you travel or work in places where you may be exposed to hepatitis A. Hepatitis B vaccine  You may need this if you have certain conditions or if you travel or work in places where you may be exposed to hepatitis B. Haemophilus influenzae type b (Hib) vaccine  You may need this if you have certain conditions. You may receive vaccines as individual doses or as more than one vaccine together in one shot (combination vaccines). Talk with your health care provider about the risks and benefits of combination vaccines. What tests do I need?  Blood tests  Lipid and cholesterol levels. These may be checked every 5 years starting at age 27.  Hepatitis C test.  Hepatitis B test. Screening  Diabetes screening. This is done by checking your blood sugar (glucose) after you have not eaten for a while (fasting).  Sexually transmitted disease (STD) testing.  BRCA-related cancer screening. This may be done if you have a family  history of breast, ovarian, tubal, or peritoneal cancers.  Pelvic exam and Pap test. This may be done every 3 years starting at age 54. Starting at age 53, this may be done every 5 years if you have a Pap test in combination with an HPV test. Talk with your health care provider about your test results, treatment options, and if necessary, the need for more tests. Follow  these instructions at home: Eating and drinking   Eat a diet that includes fresh fruits and vegetables, whole grains, lean protein, and low-fat dairy.  Take vitamin and mineral supplements as recommended by your health care provider.  Do not drink alcohol if: ? Your health care provider tells you not to drink. ? You are pregnant, may be pregnant, or are planning to become pregnant.  If you drink alcohol: ? Limit how much you have to 0-1 drink a day. ? Be aware of how much alcohol is in your drink. In the [place] one drink equals one 12 oz bottle of beer (355 mL), one 5 oz glass of wine (148 mL), or one 1 oz glass of hard liquor (44 mL). Lifestyle  Take daily care of your teeth and gums.  Stay active. Exercise for at least 30 minutes on 5 or more days each week.  Do not use any products that contain nicotine or tobacco, such as cigarettes, e-cigarettes, and chewing tobacco. If you need help quitting, ask your health care provider.  If you are sexually active, practice safe sex. Use a condom or other form of birth control (contraception) in order to prevent pregnancy and STIs (sexually transmitted infections). If you plan to become pregnant, see your health care provider for a preconception visit. What's next?  Visit your health care provider once a year for a well check visit.  Ask your health care provider how often you should have your eyes and teeth checked.  Stay up to date on all vaccines. This information is not intended to replace advice given to you by your health care provider. Make sure you discuss any questions you have with your health care provider. Document Revised: 12/03/2017 Document Reviewed: 12/03/2017 Elsevier Patient Education  2020 [place] versicolor is a skin infection. It is caused by a type of yeast. It is normal for some yeast to be on your skin, but too much yeast causes this infection. The infection causes a rash of light  or dark patches on your skin. The rash is most common on the chest, back, neck, or upper arms. The infection usually does not cause other problems. If it is treated, it will probably go away in a few weeks. The infection cannot be spread from one person to another (is not contagious). Follow these instructions at home:  Use over-the-counter and prescription medicines only as told by your doctor.  Scrub your skin every day with dandruff shampoo as told by your doctor.  Do not scratch your skin in the rash area.  Avoid places that are hot and humid.  Do not use tanning booths.  Try to avoid sweating a lot. Contact a doctor if:  Your symptoms get worse.  You have a fever.  You have redness, swelling, or pain in the rash area.  You have fluid or blood coming from your rash.  Your rash feels warm to the touch.  You have pus or a bad smell coming from your rash.  Your rash comes back (recurs) after treatment. Summary  Tinea versicolor is a skin infection. It causes a rash of light or dark patches on your skin.  The rash is most common on the chest, back, neck, or upper arms. This infection usually does not cause other problems.  Use over-the-counter and prescription medicines only as told by your doctor.  If the infection is treated, it will probably go away in a few weeks. This information is not intended to replace advice given to you by your health care provider. Make sure you discuss any questions you have with your health care provider. Document Revised: 03/06/2017 Document Reviewed: 11/24/2016 Elsevier Patient Education  2020 [employer].

## 2019-10-19 NOTE — Progress Notes (Signed)
Subjective:     Marisa Fowler is a 24 y.o. female and is here for a comprehensive physical exam. The patient reports no problems.  States has been well overall.  Pt states she has to get a COVID vaccine for her job, but is hesitant.  Pt considered getting the vaccine but wanted to do it on her own time.  Pt notes recurring light colored spots on her face and at times her neck after being in the sun.    Social History   Socioeconomic History  . Marital status: Single    Spouse name: Not on file  . Number of children: 0  . Years of education: Not on file  . Highest education level: Bachelor's degree (e.g., BA, AB, BS)  Occupational History  . Not on file  Tobacco Use  . Smoking status: Never Smoker  . Smokeless tobacco: Never Used  Vaping Use  . Vaping Use: Never used  Substance and Sexual Activity  . Alcohol use: Yes    Alcohol/week: 1.0 standard drink    Types: 1 Shots of liquor per week    Comment: socially  . Drug use: No  . Sexual activity: Never    Birth control/protection: Pill  Other Topics Concern  . Not on file  Social History Narrative   Lives at home with her dad & sister   Right handed   Caffeine: per week "maybe 16 oz if I'm craving it"   Social Determinants of Health   Financial Resource Strain:   . Difficulty of Paying Living Expenses:   Food Insecurity:   . Worried About Programme researcher, broadcasting/film/video in the Last Year:   . Barista in the Last Year:   Transportation Needs:   . Freight forwarder (Medical):   Marland Kitchen Lack of Transportation (Non-Medical):   Physical Activity:   . Days of Exercise per Week:   . Minutes of Exercise per Session:   Stress:   . Feeling of Stress :   Social Connections:   . Frequency of Communication with Friends and Family:   . Frequency of Social Gatherings with Friends and Family:   . Attends Religious Services:   . Active Member of Clubs or Organizations:   . Attends Banker Meetings:   Marland Kitchen Marital  Status:   Intimate Partner Violence:   . Fear of Current or Ex-Partner:   . Emotionally Abused:   Marland Kitchen Physically Abused:   . Sexually Abused:    Health Maintenance  Topic Date Due  . Hepatitis C Screening  Never done  . COVID-19 Vaccine (1) Never done  . HIV Screening  Never done  . TETANUS/TDAP  Never done  . PAP-Cervical Cytology Screening  Never done  . INFLUENZA VACCINE  11/06/2019  . PAP SMEAR-Modifier  10/28/2020    The following portions of the patient's history were reviewed and updated as appropriate: allergies, current medications, past family history, past medical history, past social history, past surgical history and problem list.  Review of Systems Pertinent items noted in HPI and remainder of comprehensive ROS otherwise negative.   Objective:    Temp 98.8 F (37.1 C)   Wt 114 lb 12.8 oz (52.1 kg)   BMI 21.00 kg/m  General appearance: alert, cooperative and no distress Head: Normocephalic, without obvious abnormality, atraumatic Eyes: conjunctivae/corneas clear. PERRL, EOM's intact. Fundi benign. Ears: normal TM's and external ear canals both ears Nose: Nares normal. Septum midline. Mucosa normal. No drainage or sinus  tenderness. Throat: lips, mucosa, and tongue normal; teeth and gums normal Neck: no adenopathy, no carotid bruit, no JVD, supple, symmetrical, trachea midline and R lobe thyroid enlarged, symmetric, no tenderness/mass/nodules Lungs: clear to auscultation bilaterally Heart: regular rate and rhythm, S1, S2 normal, no murmur, click, rub or gallop Abdomen: soft, non-tender; bowel sounds normal; no masses,  no organomegaly Extremities: extremities normal, atraumatic, no cyanosis or edema Pulses: 2+ and symmetric Skin: Skin color, texture, turgor normal.  Faint circumscribed hypopigmented macular lesions on chin and neck. Lymph nodes: Cervical, supraclavicular, and axillary nodes normal. Neurologic: Alert and oriented X 3, normal strength and tone.  Normal symmetric reflexes. Normal coordination and gait    Assessment:    Healthy female exam with macular rash on on face and neck.      Plan:     Anticipatory guidance given including wearing seatbelts, smoke detectors in the home, increasing physical activity, increasing p.o. intake of water and vegetables. -will obtain labs -last pap 10/28/17.  Next pap 2022 -given handout -next CPE in 1 yr See After Visit Summary for Counseling Recommendations    Educated about COVID-19 virus infection -Discussed signs and symptoms of COVID-19 virus -discussed various vaccine options including r/b/a -pt advised to speak with employer about timeline and options.  Tinea versicolor -pt to try selsan blue shampoo -given handout -continue to monitor  Vitamin D deficiency  - Plan: Vitamin D, 25-hydroxy  Goiter  -Stable - Plan: T4, Free, TSH  F/u prn  Abbe Amsterdam, MD

## 2019-10-20 LAB — TSH: TSH: 1.57 m[IU]/L

## 2019-10-20 LAB — COMPREHENSIVE METABOLIC PANEL
AG Ratio: 1.5 (calc) (ref 1.0–2.5)
ALT: 4 U/L — ABNORMAL LOW (ref 6–29)
AST: 13 U/L (ref 10–30)
Albumin: 4.2 g/dL (ref 3.6–5.1)
Alkaline phosphatase (APISO): 42 U/L (ref 31–125)
BUN: 12 mg/dL (ref 7–25)
CO2: 26 mmol/L (ref 20–32)
Calcium: 9 mg/dL (ref 8.6–10.2)
Chloride: 103 mmol/L (ref 98–110)
Creat: 0.74 mg/dL (ref 0.50–1.10)
Globulin: 2.8 g/dL (calc) (ref 1.9–3.7)
Glucose, Bld: 78 mg/dL (ref 65–99)
Potassium: 3.9 mmol/L (ref 3.5–5.3)
Sodium: 137 mmol/L (ref 135–146)
Total Bilirubin: 0.4 mg/dL (ref 0.2–1.2)
Total Protein: 7 g/dL (ref 6.1–8.1)

## 2019-10-20 LAB — CBC
HCT: 37.9 % (ref 35.0–45.0)
Hemoglobin: 11.6 g/dL — ABNORMAL LOW (ref 11.7–15.5)
MCH: 22.7 pg — ABNORMAL LOW (ref 27.0–33.0)
MCHC: 30.6 g/dL — ABNORMAL LOW (ref 32.0–36.0)
MCV: 74.3 fL — ABNORMAL LOW (ref 80.0–100.0)
MPV: 9.7 fL (ref 7.5–12.5)
Platelets: 351 10*3/uL (ref 140–400)
RBC: 5.1 Million/uL (ref 3.80–5.10)
RDW: 14.8 % (ref 11.0–15.0)
WBC: 4.6 10*3/uL (ref 3.8–10.8)

## 2019-10-20 LAB — HEMOGLOBIN A1C
Hgb A1c MFr Bld: 4.9 % of total Hgb (ref <5.7)
Mean Plasma Glucose: 94 (calc)
eAG (mmol/L): 5.2 (calc)

## 2019-10-20 LAB — VITAMIN D 25 HYDROXY (VIT D DEFICIENCY, FRACTURES): Vit D, 25-Hydroxy: 19 ng/mL — ABNORMAL LOW (ref 30–100)

## 2019-10-20 LAB — T4, FREE: Free T4: 1 ng/dL (ref 0.8–1.8)

## 2019-10-21 ENCOUNTER — Other Ambulatory Visit: Payer: Self-pay | Admitting: Family Medicine

## 2019-10-21 DIAGNOSIS — E559 Vitamin D deficiency, unspecified: Secondary | ICD-10-CM

## 2019-10-21 MED ORDER — VITAMIN D (ERGOCALCIFEROL) 1.25 MG (50000 UNIT) PO CAPS: 50000.0000 [IU] | ORAL_CAPSULE | ORAL | 0 refills | Status: DC

## 2019-10-24 ENCOUNTER — Encounter: Payer: Self-pay | Admitting: Family Medicine

## 2020-02-23 ENCOUNTER — Other Ambulatory Visit: Payer: BC Managed Care – PPO

## 2020-02-24 ENCOUNTER — Other Ambulatory Visit: Payer: BC Managed Care – PPO

## 2020-02-24 DIAGNOSIS — Z20822 Contact with and (suspected) exposure to covid-19: Secondary | ICD-10-CM

## 2020-02-26 LAB — SARS-COV-2, NAA 2 DAY TAT

## 2020-02-26 LAB — NOVEL CORONAVIRUS, NAA: SARS-CoV-2, NAA: NOT DETECTED

## 2020-03-29 ENCOUNTER — Other Ambulatory Visit: Payer: BC Managed Care – PPO

## 2020-04-05 ENCOUNTER — Other Ambulatory Visit: Payer: BC Managed Care – PPO

## 2020-04-05 ENCOUNTER — Encounter: Payer: Self-pay | Admitting: Family Medicine

## 2020-04-05 DIAGNOSIS — Z20822 Contact with and (suspected) exposure to covid-19: Secondary | ICD-10-CM

## 2020-04-07 LAB — NOVEL CORONAVIRUS, NAA: SARS-CoV-2, NAA: DETECTED — AB

## 2020-04-07 LAB — SARS-COV-2, NAA 2 DAY TAT

## 2020-04-09 ENCOUNTER — Other Ambulatory Visit: Payer: BC Managed Care – PPO

## 2020-04-26 ENCOUNTER — Ambulatory Visit
Admission: EM | Admit: 2020-04-26 | Discharge: 2020-04-26 | Disposition: A | Discharge status: Home / Self Care | Payer: BC Managed Care – PPO | Attending: Emergency Medicine | Admitting: Emergency Medicine

## 2020-04-26 ENCOUNTER — Other Ambulatory Visit: Payer: Self-pay

## 2020-04-26 DIAGNOSIS — T730XXA Starvation, initial encounter: Secondary | ICD-10-CM | POA: Diagnosis not present

## 2020-04-26 LAB — POCT URINALYSIS DIP (MANUAL ENTRY)
Bilirubin, UA: NEGATIVE
Glucose, UA: NEGATIVE mg/dL
Ketones, POC UA: NEGATIVE mg/dL
Leukocytes, UA: NEGATIVE
Nitrite, UA: NEGATIVE
Protein Ur, POC: NEGATIVE mg/dL
Spec Grav, UA: 1.02 (ref 1.010–1.025)
Urobilinogen, UA: 0.2 E.U./dL
pH, UA: 7 (ref 5.0–8.0)

## 2020-04-26 LAB — POCT FASTING CBG KUC MANUAL ENTRY: POCT Glucose (KUC): 84 mg/dL (ref 70–99)

## 2020-04-26 NOTE — ED Provider Notes (Signed)
[name]    CSN: [id number] Arrival date & time: 04/26/20  1032      History   Chief Complaint Chief Complaint  Patient presents with  . increase hunger and increase thirst    HPI Marisa Fowler is a 25 y.o. female  With history as below presenting for increased hunger, thirst, fingertip numbness x3 days.  States that she had COVID a month ago and is unsure if this could be a side effect of COVID.  States that she has been urinating more frequently, though denies burning, stinging, lower abdominal pain or vaginal discharge.  States amount of urine produced has not increased.  Denies craving certain foods.  No palpitations, tremulousness, change in hair, skin, nails.  No history of thyroid disease personally or in family.  Does have family history of diabetes, though denies personal history.  LMP 2 weeks ago: Denies risk of pregnancy.  Past Medical History:  Diagnosis Date  . Headache   . Heart murmur     Patient Active Problem List   Diagnosis Date Noted  . Tension headache, chronic 02/01/2019  . Acute bronchitis 06/28/2016  . Headache 06/20/2015  . Head injury due to trauma Jun 20, 2015  . Memory changes Jun 20, 2015  . Concentration deficit 06-20-2015  . Unhealthy sleep habit 20-Jun-2015  . Stress 06/20/2015  . Death of family member 06-20-15  . DIZZINESS 03/23/2009    Past Surgical History:  Procedure Laterality Date  . no syrgical history      OB History   No obstetric history on file.      Home Medications    Prior to Admission medications   Medication Sig Start Date End Date Taking? Authorizing Provider  nortriptyline (PAMELOR) 10 MG capsule TAKE 1 CAPSULE (10 MG TOTAL) BY MOUTH AT BEDTIME. 02/25/19   Anson Fret, MD    Family History Family History  Problem Relation Age of Onset  . Cancer Mother   . Diabetes Mother   . Hypertension Mother   . Migraines Neg Hx     Social History Social History   Tobacco Use  .  Smoking status: Never Smoker  . Smokeless tobacco: Never Used  Vaping Use  . Vaping Use: Never used  Substance Use Topics  . Alcohol use: Yes    Alcohol/week: 1.0 standard drink    Types: 1 Shots of liquor per week    Comment: socially  . Drug use: No     Allergies   Kiwi extract and Pineapple   Review of Systems Review of Systems  Constitutional: Negative for fatigue and fever.  HENT: Negative for ear pain, sinus pain, sore throat and voice change.   Eyes: Negative for pain, redness and visual disturbance.  Respiratory: Negative for cough and shortness of breath.   Cardiovascular: Negative for chest pain and palpitations.  Gastrointestinal: Negative for abdominal pain, diarrhea and vomiting.  Endocrine: Negative for cold intolerance and heat intolerance.  Musculoskeletal: Negative for arthralgias and myalgias.  Skin: Negative for rash and wound.  Neurological: Negative for syncope and headaches.     Physical Exam Triage Vital Signs ED Triage Vitals [04/26/20 1200]  Enc Vitals Group     BP 124/80     Pulse Rate 100     Resp 18     Temp 98.5 F (36.9 C)     Temp src      SpO2 99 %     Weight      Height  Head Circumference      Peak Flow      Pain Score      Pain Loc      Pain Edu?      Excl. in GC?    No data found.  Updated Vital Signs BP 124/80   Pulse 100   Temp 98.5 F (36.9 C)   Resp 18   LMP 04/19/2020   SpO2 99%   Visual Acuity Right Eye Distance:   Left Eye Distance:   Bilateral Distance:    Right Eye Near:   Left Eye Near:    Bilateral Near:     Physical Exam Constitutional:      General: She is not in acute distress.    Appearance: She is not ill-appearing or diaphoretic.  HENT:     Head: Normocephalic and atraumatic.  Eyes:     General: No scleral icterus.    Pupils: Pupils are equal, round, and reactive to light.  Cardiovascular:     Rate and Rhythm: Normal rate and regular rhythm.     Pulses: Normal pulses.     Heart  sounds: Normal heart sounds.  Pulmonary:     Effort: Pulmonary effort is normal.  Skin:    Capillary Refill: Capillary refill takes less than 2 seconds.     Coloration: Skin is not jaundiced or pale.  Neurological:     General: No focal deficit present.     Mental Status: She is alert and oriented to person, place, and time.      UC Treatments / Results  Labs (all labs ordered are listed, but only abnormal results are displayed) Labs Reviewed  POCT URINALYSIS DIP (MANUAL ENTRY) - Abnormal; Notable for the following components:      Result Value   Blood, UA moderate (*)    All other components within normal limits  POCT FASTING CBG [facility] MANUAL ENTRY    EKG   Radiology No results found.  Procedures Procedures (including critical care time)  Medications Ordered in UC Medications - No data to display  Initial Impression / Assessment and Plan / UC Course  I have reviewed the triage vital signs and the nursing notes.  Pertinent labs & imaging results that were available during my care of the patient were reviewed by me and considered in my medical decision making (see chart for details).     Urine as above.  CBG 84.  Discussed utility of lab work for further evaluation: Patient declined she has PCP appointment in 2 weeks.  Will monitor symptoms, return for worsening.  Return precautions discussed, pt verbalized understanding and is agreeable to plan. Final Clinical Impressions(s) / UC Diagnoses   Final diagnoses:  Hungry, initial encounter   Discharge Instructions   None    ED Prescriptions    None     PDMP not reviewed this encounter.   Hall-Potvin, [place], [place] 04/26/20 1352

## 2020-04-26 NOTE — ED Triage Notes (Signed)
Pt c/o increase hunger, increase thirst, and numbness to finger tips x3 days. States had covid a month ago.

## 2020-05-03 ENCOUNTER — Ambulatory Visit
Admission: EM | Admit: 2020-05-03 | Discharge: 2020-05-03 | Disposition: A | Discharge status: Home / Self Care | Payer: BC Managed Care – PPO | Attending: Urgent Care | Admitting: Urgent Care

## 2020-05-03 ENCOUNTER — Other Ambulatory Visit: Payer: Self-pay

## 2020-05-03 DIAGNOSIS — J0100 Acute maxillary sinusitis, unspecified: Secondary | ICD-10-CM

## 2020-05-03 DIAGNOSIS — Z8616 Personal history of COVID-19: Secondary | ICD-10-CM

## 2020-05-03 MED ORDER — PREDNISONE 20 MG PO TABS: ORAL_TABLET | ORAL | 0 refills | Status: DC

## 2020-05-03 MED ORDER — CETIRIZINE HCL 10 MG PO TABS: 10.0000 mg | ORAL_TABLET | Freq: Every day | ORAL | 0 refills | Status: DC

## 2020-05-03 NOTE — ED Triage Notes (Signed)
Patient states she had a positive covid test and was symptomatic a month ago. Pt states symptoms improved but now has a sore throat x 1 week as well as new runny nose. Pt is aox4 and ambulatory.

## 2020-05-03 NOTE — ED Provider Notes (Signed)
[facility]   MRN: [id number] DOB: 1996/02/17  Subjective:   Marisa Fowler is a 25 y.o. female presenting for 1 week history of recurrent runny nose, stuffy nose, scratchy throat, throat discomfort.  Patient tested positive for COVID-19 about a month ago.  She recovered from similar symptoms after about 2 weeks.  Developed these new symptoms this past week.  Denies fever, sinus pain, cough, chest pain, shortness of breath.  Denies history of allergies.  Patient is not a smoker.  She does have a doggie at home.  No current facility-administered medications for this encounter.  Current Outpatient Medications:  .  nortriptyline (PAMELOR) 10 MG capsule, TAKE 1 CAPSULE (10 MG TOTAL) BY MOUTH AT BEDTIME., Disp: 90 capsule, Rfl: 2   Allergies  Allergen Reactions  . Kiwi Extract   . Pineapple     Past Medical History:  Diagnosis Date  . Headache   . Heart murmur      Past Surgical History:  Procedure Laterality Date  . no syrgical history      Family History  Problem Relation Age of Onset  . Cancer Mother   . Diabetes Mother   . Hypertension Mother   . Migraines Neg Hx     Social History   Tobacco Use  . Smoking status: Never Smoker  . Smokeless tobacco: Never Used  Vaping Use  . Vaping Use: Never used  Substance Use Topics  . Alcohol use: Yes    Alcohol/week: 1.0 standard drink    Types: 1 Shots of liquor per week    Comment: socially  . Drug use: No    ROS   Objective:   Vitals: BP (!) 128/91 (BP Location: Left Arm)   Pulse 87   Temp 98.4 F (36.9 C) (Oral)   Resp 17   LMP 04/19/2020 (Exact Date)   SpO2 99%   Physical Exam Constitutional:      General: She is not in acute distress.    Appearance: She is well-developed. She is not ill-appearing, toxic-appearing or diaphoretic.  HENT:     Head: Normocephalic and atraumatic.     Right Ear: Tympanic membrane and ear canal normal. No drainage, swelling or tenderness. No middle ear  effusion. Tympanic membrane is not erythematous.     Left Ear: Tympanic membrane and ear canal normal. No drainage, swelling or tenderness.  No middle ear effusion. Tympanic membrane is not erythematous.     Nose: Congestion and rhinorrhea present.     Mouth/Throat:     Mouth: Mucous membranes are moist. No oral lesions.     Pharynx: No pharyngeal swelling, oropharyngeal exudate, posterior oropharyngeal erythema or uvula swelling.     Tonsils: No tonsillar exudate or tonsillar abscesses.     Comments: Postnasal drainage overlying pharynx.  No sinus tenderness. Eyes:     Extraocular Movements:     Right eye: Normal extraocular motion.     Left eye: Normal extraocular motion.     Conjunctiva/sclera: Conjunctivae normal.     Pupils: Pupils are equal, round, and reactive to light.  Cardiovascular:     Rate and Rhythm: Normal rate.  Pulmonary:     Effort: Pulmonary effort is normal.  Musculoskeletal:     Cervical back: Normal range of motion and neck supple.  Lymphadenopathy:     Cervical: No cervical adenopathy.  Skin:    General: Skin is warm and dry.  Neurological:     General: No focal deficit present.  Mental Status: She is alert and oriented to person, place, and time.  Psychiatric:        Mood and Affect: Mood normal.        Behavior: Behavior normal.      Assessment and Plan :   PDMP not reviewed this encounter.  1. Acute non-recurrent maxillary sinusitis   2. History of COVID-19     Suspect nonbacterial sinusitis recommended Zyrtec, nasal course.  Use supportive care otherwise.  If patient continues to have symptoms in the next 3 days, recommended antibiotic, amoxicillin, to address bacterial sinus infection. Counseled patient on potential for adverse effects with medications prescribed/recommended today, ER and return-to-clinic precautions discussed, patient verbalized understanding.    Wallis Bamberg, [place] 05/03/20 1042

## 2020-05-03 NOTE — Discharge Instructions (Signed)
We will use a trial of prednisone to help with your current sinusitis (sinus inflammation). I do want you to take Zyrtec daily as well. If your symptoms have not improved by Sunday, then we can trial an antibiotic like amoxicillin to address a bacterial sinus infection.

## 2020-05-16 ENCOUNTER — Other Ambulatory Visit: Payer: Self-pay

## 2020-05-16 ENCOUNTER — Encounter: Payer: Self-pay | Admitting: Family

## 2020-05-16 ENCOUNTER — Ambulatory Visit (INDEPENDENT_AMBULATORY_CARE_PROVIDER_SITE_OTHER): Payer: BC Managed Care – PPO | Admitting: Family

## 2020-05-16 VITALS — BP 126/90 | HR 113 | Ht 63.27 in | Wt 116.0 lb

## 2020-05-16 DIAGNOSIS — R631 Polydipsia: Secondary | ICD-10-CM | POA: Diagnosis not present

## 2020-05-16 DIAGNOSIS — N946 Dysmenorrhea, unspecified: Secondary | ICD-10-CM | POA: Insufficient documentation

## 2020-05-16 DIAGNOSIS — Z7689 Persons encountering health services in other specified circumstances: Secondary | ICD-10-CM | POA: Diagnosis not present

## 2020-05-16 DIAGNOSIS — N921 Excessive and frequent menstruation with irregular cycle: Secondary | ICD-10-CM | POA: Insufficient documentation

## 2020-05-16 NOTE — Patient Instructions (Addendum)
Return for annual physical examination, labs, and health maintenance. Arrive fasting meaning having had no food and/or nothing to drink for at least 8 hours prior to appointment.  Melatonin over-the-counter for insomnia.  Tetanus vaccine today. Thank you for choosing [facility] at [facility] for your medical home!    Marisa Fowler was seen by Rema Fendt, NP today.   Minette Headland Johannes's primary care provider is Georgie Haque Jodi Geralds, NP.   For the best care possible,  you should try to see Ricky Stabs, NP whenever you come to clinic.   We look forward to seeing you again soon!  If you have any questions about your visit today,  please call us at [phone]  Or feel free to reach your provider via MyChart.    Insomnia Insomnia is a sleep disorder that makes it difficult to fall asleep or stay asleep. Insomnia can cause fatigue, low energy, difficulty concentrating, mood swings, and poor performance at work or school. There are three different ways to classify insomnia:  Difficulty falling asleep.  Difficulty staying asleep.  Waking up too early in the morning. Any type of insomnia can be long-term (chronic) or short-term (acute). Both are common. Short-term insomnia usually lasts for three months or less. Chronic insomnia occurs at least three times a week for longer than three months. What are the causes? Insomnia may be caused by another condition, situation, or substance, such as:  Anxiety.  Certain medicines.  Gastroesophageal reflux disease (GERD) or other gastrointestinal conditions.  Asthma or other breathing conditions.  Restless legs syndrome, sleep apnea, or other sleep disorders.  Chronic pain.  Menopause.  Stroke.  Abuse of alcohol, tobacco, or illegal drugs.  Mental health conditions, such as depression.  Caffeine.  Neurological disorders, such as Alzheimer's disease.  An overactive thyroid (hyperthyroidism). Sometimes, the  cause of insomnia may not be known. What increases the risk? Risk factors for insomnia include:  Gender. Women are affected more often than men.  Age. Insomnia is more common as you get older.  Stress.  Lack of exercise.  Irregular work schedule or working night shifts.  Traveling between different time zones.  Certain medical and mental health conditions. What are the signs or symptoms? If you have insomnia, the main symptom is having trouble falling asleep or having trouble staying asleep. This may lead to other symptoms, such as:  Feeling fatigued or having low energy.  Feeling nervous about going to sleep.  Not feeling rested in the morning.  Having trouble concentrating.  Feeling irritable, anxious, or depressed. How is this diagnosed? This condition may be diagnosed based on:  Your symptoms and medical history. Your health care provider may ask about: ? Your sleep habits. ? Any medical conditions you have. ? Your mental health.  A physical exam. How is this treated? Treatment for insomnia depends on the cause. Treatment may focus on treating an underlying condition that is causing insomnia. Treatment may also include:  Medicines to help you sleep.  Counseling or therapy.  Lifestyle adjustments to help you sleep better. Follow these instructions at home: Eating and drinking  Limit or avoid alcohol, caffeinated beverages, and cigarettes, especially close to bedtime. These can disrupt your sleep.  Do not eat a large meal or eat spicy foods right before bedtime. This can lead to digestive discomfort that can make it hard for you to sleep.   Sleep habits  Keep a sleep diary to help you and your health care provider  figure out what could be causing your insomnia. Write down: ? When you sleep. ? When you wake up during the night. ? How well you sleep. ? How rested you feel the next day. ? Any side effects of medicines you are taking. ? What you eat and  drink.  Make your bedroom a dark, comfortable place where it is easy to fall asleep. ? Put up shades or blackout curtains to block light from outside. ? Use a white noise machine to block noise. ? Keep the temperature cool.  Limit screen use before bedtime. This includes: ? Watching TV. ? Using your smartphone, tablet, or computer.  Stick to a routine that includes going to bed and waking up at the same times every day and night. This can help you fall asleep faster. Consider making a quiet activity, such as reading, part of your nighttime routine.  Try to avoid taking naps during the day so that you sleep better at night.  Get out of bed if you are still awake after 15 minutes of trying to sleep. Keep the lights down, but try reading or doing a quiet activity. When you feel sleepy, go back to bed.   General instructions  Take over-the-counter and prescription medicines only as told by your health care provider.  Exercise regularly, as told by your health care provider. Avoid exercise starting several hours before bedtime.  Use relaxation techniques to manage stress. Ask your health care provider to suggest some techniques that may work well for you. These may include: ? Breathing exercises. ? Routines to release muscle tension. ? Visualizing peaceful scenes.  Make sure that you drive carefully. Avoid driving if you feel very sleepy.  Keep all follow-up visits as told by your health care provider. This is important. Contact a health care provider if:  You are tired throughout the day.  You have trouble in your daily routine due to sleepiness.  You continue to have sleep problems, or your sleep problems get worse. Get help right away if:  You have serious thoughts about hurting yourself or someone else. If you ever feel like you may hurt yourself or others, or have thoughts about taking your own life, get help right away. You can go to your nearest emergency department or  call:  Your local emergency services (911 in the [place]  A suicide crisis helpline, such as the National Suicide Prevention Lifeline at 587-581-6358. This is open 24 hours a day. Summary  Insomnia is a sleep disorder that makes it difficult to fall asleep or stay asleep.  Insomnia can be long-term (chronic) or short-term (acute).  Treatment for insomnia depends on the cause. Treatment may focus on treating an underlying condition that is causing insomnia.  Keep a sleep diary to help you and your health care provider figure out what could be causing your insomnia. This information is not intended to replace advice given to you by your health care provider. Make sure you discuss any questions you have with your health care provider. Document Revised: 02/02/2020 Document Reviewed: 02/02/2020 Elsevier Patient Education  2021 [employer].

## 2020-05-16 NOTE — Progress Notes (Signed)
Establish care Extremely thirsty since Christmas

## 2020-05-16 NOTE — Progress Notes (Signed)
Subjective:    Marisa Fowler - 25 y.o. female MRN [id number]  Date of birth: 05-12-1995  HPI  Kenniyah Sasaki is to establish care. Patient has a PMH significant for acute bronchitis, tension headache chronic, postconcussion syndrome, dysmenorrhea, dizziness, headaches, memory changes, concentration deficit, stress, and menometorrhagia.   Current issues and/or concerns: Reports since being diagnosed with Covid around Christmas 2021 she has increased thirst. Denies any additional possible symptoms of Covid such as but not limited to shortness of breath, cough, fever, body aches, and chest pain. Hemoglobin A1c 4.9% last obtained 10/19/2019. Denies increased thirst and increased hunger at this time. Says she is drinking plenty of water to stay hydrated.   ROS per HPI   Health Maintenance:  Health Maintenance Due  Topic Date Due  . Hepatitis C Screening  Never done  . HIV Screening  Never done  . PAP-Cervical Cytology Screening  Never done     Past Medical History: Patient Active Problem List   Diagnosis Date Noted  . Dysmenorrhea 05/16/2020  . Menometrorrhagia 05/16/2020  . Tension headache, chronic 02/01/2019  . Acute bronchitis 06/28/2016  . Postconcussion syndrome 06/20/2015  . Headache 2015/07/18  . Head injury due to trauma 07/18/2015  . Memory changes 07/18/15  . Concentration deficit 07-18-15  . Unhealthy sleep habit 07-18-2015  . Stress 07-18-15  . Death of family member 07/18/15  . DIZZINESS 03/23/2009    Social History   reports that she has never smoked. She has never used smokeless tobacco. She reports current alcohol use of about 1.0 standard drink of alcohol per week. She reports that she does not use drugs.   Family History  family history includes Cancer in her mother; Diabetes in her mother; Hypertension in her mother.   Medications: reviewed and updated   Objective:   Physical Exam BP 126/90 (BP Location: Left Arm, Patient  Position: Sitting)   Pulse (!) 113   Ht 5' 3.27" (1.607 m)   Wt 116 lb (52.6 kg)   LMP 04/19/2020 (Exact Date)   SpO2 97%   BMI 20.37 kg/m  Physical Exam HENT:     Head: Normocephalic.  Eyes:     Extraocular Movements: Extraocular movements intact.     Pupils: Pupils are equal, round, and reactive to light.  Cardiovascular:     Rate and Rhythm: Tachycardia present.     Pulses: Normal pulses.     Heart sounds: Normal heart sounds.  Pulmonary:     Effort: Pulmonary effort is normal.     Breath sounds: Normal breath sounds.  Musculoskeletal:     Cervical back: Normal range of motion and neck supple. No tenderness.  Neurological:     General: No focal deficit present.     Mental Status: She is alert and oriented to person, place, and time.  Psychiatric:        Mood and Affect: Mood normal.        Behavior: Behavior normal.      Assessment & Plan:  1. Encounter to establish care: - Patient presents today to establish care.  - Return for annual physical examination, labs, and health maintenance. Arrive fasting meaning having had no food and/or nothing to drink for at least 8 hours prior to appointment.  2. Increased thirst: - Reports since being diagnosed with Covid around Christmas 2021 she has increased thirst. Denies any additional possible symptoms of Covid such as but not limited to shortness of breath, cough, fever, body aches, and chest pain.  Hemoglobin A1c 4.9% last obtained 10/19/2019. Denies increased hunger at this time. Says she is drinking plenty of water to stay hydrated.  - Will recheck baseline blood work at physical exam.   Ricky Stabs, NP 05/19/2020, 8:45 PM Primary Care at [facility]

## 2020-06-27 ENCOUNTER — Encounter: Payer: Self-pay | Admitting: Family

## 2020-07-10 ENCOUNTER — Other Ambulatory Visit: Payer: Self-pay

## 2020-07-10 ENCOUNTER — Ambulatory Visit
Admission: EM | Admit: 2020-07-10 | Discharge: 2020-07-10 | Disposition: A | Discharge status: Home / Self Care | Payer: BC Managed Care – PPO | Attending: Student | Admitting: Student

## 2020-07-10 DIAGNOSIS — J301 Allergic rhinitis due to pollen: Secondary | ICD-10-CM

## 2020-07-10 MED ORDER — PREDNISONE 20 MG PO TABS: 40.0000 mg | ORAL_TABLET | Freq: Every day | ORAL | 0 refills | Status: AC

## 2020-07-10 MED ORDER — MONTELUKAST SODIUM 10 MG PO TABS: 10.0000 mg | ORAL_TABLET | Freq: Every day | ORAL | 2 refills | Status: DC

## 2020-07-10 NOTE — ED Provider Notes (Signed)
[name]    CSN: [id number] Arrival date & time: 07/10/20  1746      History   Chief Complaint Chief Complaint  Patient presents with  . Nasal Congestion    HPI Marisa Fowler is a 25 y.o. female presenting with nasal congestion for 1 month.  History of allergic rhinitis that is poorly controlled on current Zyrtec and Flonase.  States that she was given a course of prednisone about 1 month ago and this did help with the symptoms but then they returned.  Denies facial pressure, fever/chills, bloody sputum.  Denies n/v/d, shortness of breath, chest pain, cough, facial pain, teeth pain, headaches, sore throat, loss of taste/smell, swollen lymph nodes, ear pain.    HPI  Past Medical History:  Diagnosis Date  . Headache   . Heart murmur     Patient Active Problem List   Diagnosis Date Noted  . Dysmenorrhea 05/16/2020  . Menometrorrhagia 05/16/2020  . Tension headache, chronic 02/01/2019  . Acute bronchitis 06/28/2016  . Postconcussion syndrome 06/20/2015  . Headache 24-Jun-2015  . Head injury due to trauma 06/24/2015  . Memory changes 24-Jun-2015  . Concentration deficit 2015/06/24  . Unhealthy sleep habit Jun 24, 2015  . Stress 2015/06/24  . Death of family member 24-Jun-2015  . DIZZINESS 03/23/2009    Past Surgical History:  Procedure Laterality Date  . no syrgical history      OB History   No obstetric history on file.      Home Medications    Prior to Admission medications   Medication Sig Start Date End Date Taking? Authorizing Provider  montelukast (SINGULAIR) 10 MG tablet Take 1 tablet (10 mg total) by mouth at bedtime. 07/10/20  Yes Rhys Martini, PA-C  predniSONE (DELTASONE) 20 MG tablet Take 2 tablets (40 mg total) by mouth daily for 5 days. 07/10/20 07/15/20 Yes Rhys Martini, PA-C  JUNEL FE 1/20 1-20 MG-MCG tablet Take 1 tablet by mouth daily. 03/18/20   [provider]    Family History Family History  Problem  Relation Age of Onset  . Cancer Mother   . Diabetes Mother   . Hypertension Mother   . Migraines Neg Hx     Social History Social History   Tobacco Use  . Smoking status: Never Smoker  . Smokeless tobacco: Never Used  Vaping Use  . Vaping Use: Never used  Substance Use Topics  . Alcohol use: Yes    Alcohol/week: 1.0 standard drink    Types: 1 Shots of liquor per week    Comment: socially  . Drug use: No     Allergies   Kiwi extract and Pineapple   Review of Systems Review of Systems  Constitutional: Negative for appetite change, chills and fever.  HENT: Positive for congestion. Negative for ear pain, rhinorrhea, sinus pressure, sinus pain and sore throat.   Eyes: Negative for redness and visual disturbance.  Respiratory: Negative for cough, chest tightness, shortness of breath and wheezing.   Cardiovascular: Negative for chest pain and palpitations.  Gastrointestinal: Negative for abdominal pain, constipation, diarrhea, nausea and vomiting.  Genitourinary: Negative for dysuria, frequency and urgency.  Musculoskeletal: Negative for myalgias.  Neurological: Negative for dizziness, weakness and headaches.  Psychiatric/Behavioral: Negative for confusion.  All other systems reviewed and are negative.    Physical Exam Triage Vital Signs ED Triage Vitals  Enc Vitals Group     BP 07/10/20 1852 128/87     Pulse Rate 07/10/20 1852 88  Resp 07/10/20 1852 20     Temp 07/10/20 1852 99 F (37.2 C)     Temp Source 07/10/20 1852 Oral     SpO2 --      Weight --      Height --      Head Circumference --      Peak Flow --      Pain Score 07/10/20 1902 0     Pain Loc --      Pain Edu? --      Excl. in GC? --    No data found.  Updated Vital Signs BP 128/87 (BP Location: Left Arm)   Pulse 88   Temp 99 F (37.2 C) (Oral)   Resp 20   LMP 07/02/2020   Visual Acuity Right Eye Distance:   Left Eye Distance:   Bilateral Distance:    Right Eye Near:   Left Eye  Near:    Bilateral Near:     Physical Exam Vitals reviewed.  Constitutional:      General: She is not in acute distress.    Appearance: Normal appearance. She is not ill-appearing.  HENT:     Head: Normocephalic and atraumatic.     Right Ear: Hearing, tympanic membrane, ear canal and external ear normal. No swelling or tenderness. There is no impacted cerumen. No mastoid tenderness. Tympanic membrane is not perforated, erythematous, retracted or bulging.     Left Ear: Hearing, tympanic membrane, ear canal and external ear normal. No swelling or tenderness. There is no impacted cerumen. No mastoid tenderness. Tympanic membrane is not perforated, erythematous, retracted or bulging.     Nose: Congestion present.     Right Sinus: No maxillary sinus tenderness or frontal sinus tenderness.     Left Sinus: No maxillary sinus tenderness or frontal sinus tenderness.     Mouth/Throat:     Mouth: Mucous membranes are moist.     Pharynx: Uvula midline. No oropharyngeal exudate or posterior oropharyngeal erythema.     Tonsils: No tonsillar exudate.  Cardiovascular:     Rate and Rhythm: Normal rate and regular rhythm.     Heart sounds: Normal heart sounds.  Pulmonary:     Breath sounds: Normal breath sounds and air entry. No wheezing, rhonchi or rales.  Chest:     Chest wall: No tenderness.  Abdominal:     General: Abdomen is flat. Bowel sounds are normal.     Tenderness: There is no abdominal tenderness. There is no guarding or rebound.  Lymphadenopathy:     Cervical: No cervical adenopathy.  Neurological:     General: No focal deficit present.     Mental Status: She is alert and oriented to person, place, and time.  Psychiatric:        Attention and Perception: Attention and perception normal.        Mood and Affect: Mood and affect normal.        Behavior: Behavior normal. Behavior is cooperative.        Thought Content: Thought content normal.        Judgment: Judgment normal.       UC Treatments / Results  Labs (all labs ordered are listed, but only abnormal results are displayed) Labs Reviewed - No data to display  EKG   Radiology No results found.  Procedures Procedures (including critical care time)  Medications Ordered in UC Medications - No data to display  Initial Impression / Assessment and Plan / UC Course  I  have reviewed the triage vital signs and the nursing notes.  Pertinent labs & imaging results that were available during my care of the patient were reviewed by me and considered in my medical decision making (see chart for details).     Patient is a 25 year old female presenting with allergic rhinitis. Today this pt is afebrile nontachycardic nontachypneic, oxygenating well on room air, no wheezes rhonchi or rales.   Singulair and Flonase. Follow-up with ENT if symptoms persist. ED return precautions discussed.  This chart was dictated using voice recognition software, Dragon. Despite the best efforts of this provider to proofread and correct errors, errors may still occur which can change documentation meaning.  Final Clinical Impressions(s) / UC Diagnoses   Final diagnoses:  Seasonal allergic rhinitis due to pollen     Discharge Instructions     -Stop Zyrtec and start the new antihistamine-Singulair (montelukast).  Take 1 pill daily at bedtime. -Also take the prednisone-2 pills taken togther in the morning for 5 days. -You can continue Flonase as needed. -If symptoms persist, follow-up with ear nose and throat doctor.  Information below.    ED Prescriptions    Medication Sig Dispense Auth. Provider   montelukast (SINGULAIR) 10 MG tablet Take 1 tablet (10 mg total) by mouth at bedtime. 30 tablet Rhys Martini, PA-C   predniSONE (DELTASONE) 20 MG tablet Take 2 tablets (40 mg total) by mouth daily for 5 days. 10 tablet Rhys Martini, PA-C     PDMP not reviewed this encounter.   Rhys Martini, PA-C 07/10/20  1912

## 2020-07-10 NOTE — ED Triage Notes (Signed)
Pt presents with c/o nasal congestion for past month  

## 2020-07-10 NOTE — Discharge Instructions (Addendum)
-  Stop Zyrtec and start the new antihistamine-Singulair (montelukast).  Take 1 pill daily at bedtime. -Also take the prednisone-2 pills taken togther in the morning for 5 days. -You can continue Flonase as needed. -If symptoms persist, follow-up with ear nose and throat doctor.  Information below.

## 2021-01-22 ENCOUNTER — Ambulatory Visit: Admit: 2021-01-22 | Discharge status: Absent without leave | Payer: BC Managed Care – PPO

## 2021-01-23 ENCOUNTER — Ambulatory Visit: Payer: Self-pay

## 2021-02-04 ENCOUNTER — Encounter: Payer: Self-pay | Admitting: Physician Assistant

## 2021-02-04 ENCOUNTER — Telehealth: Payer: BC Managed Care – PPO | Admitting: Physician Assistant

## 2021-02-04 DIAGNOSIS — R059 Cough, unspecified: Secondary | ICD-10-CM | POA: Diagnosis not present

## 2021-02-04 MED ORDER — PREDNISONE 10 MG (21) PO TBPK: ORAL_TABLET | Freq: Every day | ORAL | 0 refills | Status: DC

## 2021-02-04 MED ORDER — BENZONATATE 100 MG PO CAPS: 100.0000 mg | ORAL_CAPSULE | Freq: Three times a day (TID) | ORAL | 0 refills | Status: AC

## 2021-02-04 NOTE — Progress Notes (Signed)
[name], Marisa Fowler are scheduled for a virtual visit with your provider today.    Just as we do with appointments in the office, we must obtain your consent to participate.  Your consent will be active for this visit and any virtual visit you may have with one of our providers in the next 365 days.    If you have a MyChart account, I can also send a copy of this consent to you electronically.  All virtual visits are billed to your insurance company just like a traditional visit in the office.  As this is a virtual visit, video technology does not allow for your provider to perform a traditional examination.  This may limit your provider's ability to fully assess your condition.  If your provider identifies any concerns that need to be evaluated in person or the need to arrange testing such as labs, EKG, etc, we will make arrangements to do so.    Although advances in technology are sophisticated, we cannot ensure that it will always work on either your end or our end.  If the connection with a video visit is poor, we may have to switch to a telephone visit.  With either a video or telephone visit, we are not always able to ensure that we have a secure connection.   I need to obtain your verbal consent now.   Are you willing to proceed with your visit today?   Marisa Fowler has provided verbal consent on 02/04/2021 for a virtual visit (video or telephone).   Marisa Meres, PA-C 02/04/2021  2:51 PM   Date:  02/04/2021   ID:  Marisa Fowler, DOB September 17, 1995, MRN [id number]  Patient Location: Home Provider Location: Home Office   Participants: Patient and Provider for Visit and Wrap up  Method of visit: Video  Location of Patient: Home Location of Provider: Home Office Consent was obtain for visit over the video. Services rendered by provider: Visit was performed via video  A video enabled telemedicine application was used and I verified that I am speaking with the correct person  using two identifiers.  PCP:  Rema Fendt, NP   Chief Complaint:  cough  History of Present Illness:    Marisa Fowler is a 25 y.o. female with history as stated below. Presents video telehealth for an acute care visit  Cough for the last 2 weeks. States she was dx with the flu 2 weeks ago and since then her cough has been getting worse. She denies fevers, wheezing, sob, chest pain. The cough is intermittently productive. Denies any other associated uri sxs. She has been taking delsym without relief   Past Medical, Surgical, Social History, Allergies, and Medications have been Reviewed.  Past Medical History:  Diagnosis Date   Headache    Heart murmur     No outpatient medications have been marked as taking for the 02/04/21 encounter (Video Visit) with Yoanna Jurczyk S, PA-C.     Allergies:   Kiwi extract and Pineapple   ROS See HPI for history of present illness.  Physical Exam Vitals and nursing note reviewed.  Constitutional:      General: She is not in acute distress.    Appearance: She is well-developed.  HENT:     Head: Normocephalic and atraumatic.  Eyes:     Conjunctiva/sclera: Conjunctivae normal.  Pulmonary:     Effort: Pulmonary effort is normal.     Comments: Speaking in full sentences Musculoskeletal:  General: Normal range of motion.     Cervical back: Neck supple.  Skin:    General: Skin is warm and dry.  Neurological:     Mental Status: She is alert.            MDM: c/o continued cough after influenza 2 weeks ago. Discussed disease course and advised that this is likely lingering from her recent infection. No systemic sxs or sob to suggest bacterial pneumonia. Tx with tessalon and steroids. Advised on f/u plan. Pt voices understanding and agreement  There are no diagnoses linked to this encounter.   Time:   Today, I have spent 10 minutes with the patient with telehealth technology discussing the above problems, reviewing the  chart, previous notes, medications and orders.    Tests Ordered: No orders of the defined types were placed in this encounter.   Medication Changes: No orders of the defined types were placed in this encounter.    Disposition:  Follow up  Signed, Marisa Meres, PA-C  02/04/2021 2:51 PM

## 2021-02-04 NOTE — Patient Instructions (Addendum)
  Blossom Chantele Baysinger, thank you for joining [employer], PA-C for today's virtual visit.  While this provider is not your primary care provider (PCP), if your PCP is located in our provider database this encounter information will be shared with them immediately following your visit.  Consent: (Patient) Marisa Fowler provided verbal consent for this virtual visit at the beginning of the encounter.  Current Medications:  Current Outpatient Medications:    JUNEL FE 1/20 1-20 MG-MCG tablet, Take 1 tablet by mouth daily., Disp: , Rfl:    montelukast (SINGULAIR) 10 MG tablet, Take 1 tablet (10 mg total) by mouth at bedtime., Disp: 30 tablet, Rfl: 2   Medications ordered in this encounter:  No orders of the defined types were placed in this encounter.    *If you need refills on other medications prior to your next appointment, please contact your pharmacy*  Follow-Up: Call back or seek an in-person evaluation if the symptoms worsen or if the condition fails to improve as anticipated.  Other Instructions Take tessalon and prednisone as directed   Follow up with your doctor in the next week   Seek evaluation in person sooner for any worsening symptoms  If you have been instructed to have an in-person evaluation today at a local Urgent Care facility, please use the link below. It will take you to a list of all of our available [facility], including address, phone number and hours of operation. Please do not delay care.  [facility]  If you or a family member do not have a primary care provider, use the link below to schedule a visit and establish care. When you choose a [facility] primary care physician or advanced practice provider, you gain a long-term partner in health. Find a Primary Care Provider  Learn more about [facility] in-office and virtual care options: [facility] - Get Care Now

## 2021-05-02 NOTE — Progress Notes (Signed)
Patient ID: Marisa Fowler, female    DOB: October 17, 1995  MRN: [id number]  CC: Cold Symptoms  Subjective: Marisa Fowler is a 26 y.o. female who presents for cold symptoms.   Her concerns today include:  Reports frequent cold symptoms. Intermittent for at least 12 months. No symptoms today. A few months ago seen at the Urgent Care for a cough of 2 weeks and prescribed Prednisone. More recently noticed a sore throat that lasted a few days and then resolved. Denies chest pain, shortness of breath,  cough, difficulty swallowing, neck pain, ear infections, and additional red flag symptoms. Does have a history of Covid. Noticed frequent cold symptoms developed after having Covid. No recent testing of flu or Covid and declines testing today. 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 She does use a mask while at work. Tends not to wear a mask if not at work. Has a dog for years but has considered if she has become allergic to the same.  Patient Active Problem List   Diagnosis Date Noted   Dysmenorrhea 05/16/2020   Menometrorrhagia 05/16/2020   Tension headache, chronic 02/01/2019   Acute bronchitis 06/28/2016   Postconcussion syndrome 06/20/2015   Headache 07/02/2015   Head injury due to trauma 07/02/15   Memory changes 2015/07/02   Concentration deficit July 02, 2015   Unhealthy sleep habit 07/02/2015   Stress July 02, 2015   Death of family member 2015/07/02   DIZZINESS 03/23/2009     Current Outpatient Medications on File Prior to Visit  Medication Sig Dispense Refill   JUNEL FE 1/20 1-20 MG-MCG tablet Take 1 tablet by mouth daily.     montelukast (SINGULAIR) 10 MG tablet Take 1 tablet (10 mg total) by mouth at bedtime. 30 tablet 2   No current facility-administered medications on file prior to visit.    Allergies  Allergen Reactions   Kiwi Extract Swelling   Pineapple Hives    Social History   Socioeconomic History   Marital status: Single     Spouse name: Not on file   Number of children: 0   Years of education: Not on file   Highest education level: Bachelor's degree (e.g., BA, AB, BS)  Occupational History   Not on file  Tobacco Use   Smoking status: Never   Smokeless tobacco: Never  Vaping Use   Vaping Use: Never used  Substance and Sexual Activity   Alcohol use: Yes    Alcohol/week: 1.0 standard drink    Types: 1 Shots of liquor per week    Comment: socially   Drug use: No   Sexual activity: Never    Birth control/protection: Pill  Other Topics Concern   Not on file  Social History Narrative   Lives at home with her dad & sister   Right handed   Caffeine: per week "maybe 16 oz if I'm craving it"   Social Determinants of Corporate investment banker Strain: Not on file  Food Insecurity: Not on file  Transportation Needs: Not on file  Physical Activity: Not on file  Stress: Not on file  Social Connections: Not on file  Intimate Partner Violence: Not on file    Family History  Problem Relation Age of Onset   Cancer Mother    Diabetes Mother    Hypertension Mother    Migraines Neg Hx     Past Surgical History:  Procedure Laterality Date   no syrgical history      ROS:  Review of Systems Negative except as stated above  PHYSICAL EXAM: BP 123/83 (BP Location: Left Arm, Patient Position: Sitting, Cuff Size: Normal)    Pulse 83    Temp 98.5 F (36.9 C)    Resp 18    Ht 5' 3.27" (1.607 m)    Wt 117 lb (53.1 kg)    SpO2 97%    BMI 20.55 kg/m   Physical Exam HENT:     Head: Normocephalic and atraumatic.     Right Ear: Tympanic membrane, ear canal and external ear normal.     Left Ear: Tympanic membrane, ear canal and external ear normal.  Eyes:     Extraocular Movements: Extraocular movements intact.     Conjunctiva/sclera: Conjunctivae normal.     Pupils: Pupils are equal, round, and reactive to light.  Cardiovascular:     Rate and Rhythm: Normal rate and regular rhythm.     Pulses: Normal  pulses.     Heart sounds: Normal heart sounds.  Pulmonary:     Effort: Pulmonary effort is normal.     Breath sounds: Normal breath sounds.  Musculoskeletal:     Cervical back: Normal range of motion and neck supple.  Neurological:     General: No focal deficit present.     Mental Status: She is alert and oriented to person, place, and time.  Psychiatric:        Mood and Affect: Mood normal.        Behavior: Behavior normal.   ASSESSMENT AND PLAN: 1. Perennial allergic rhinitis: - Symptomology sounds more consistent with possible allergies.  - Try course of over-the-counter allergy medication. - Referral to Allergy for further evaluation and management.  - Ambulatory referral to Allergy    Patient was given the opportunity to ask questions.  Patient verbalized understanding of the plan and was able to repeat key elements of the plan. Patient was given clear instructions to go to Emergency Department or return to medical center if symptoms don't improve, worsen, or new problems develop.The patient verbalized understanding.   Orders Placed This Encounter  Procedures   Ambulatory referral to Allergy    Follow-up with primary provider as scheduled.  Camillia Herter, NP

## 2021-05-03 ENCOUNTER — Ambulatory Visit (INDEPENDENT_AMBULATORY_CARE_PROVIDER_SITE_OTHER): Payer: BC Managed Care – PPO | Admitting: Family

## 2021-05-03 VITALS — BP 123/83 | HR 83 | Temp 98.5°F | Resp 18 | Ht 63.27 in | Wt 117.0 lb

## 2021-05-03 DIAGNOSIS — J3089 Other allergic rhinitis: Secondary | ICD-10-CM | POA: Diagnosis not present

## 2021-05-03 NOTE — Progress Notes (Signed)
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

## 2021-06-20 ENCOUNTER — Ambulatory Visit: Payer: BC Managed Care – PPO | Admitting: Allergy

## 2021-08-01 ENCOUNTER — Ambulatory Visit: Payer: BC Managed Care – PPO | Admitting: Internal Medicine

## 2021-08-01 ENCOUNTER — Encounter: Payer: Self-pay | Admitting: Internal Medicine

## 2021-08-01 VITALS — BP 120/76 | HR 60 | Temp 97.5°F | Resp 16 | Ht 62.6 in | Wt 115.7 lb

## 2021-08-01 DIAGNOSIS — J302 Other seasonal allergic rhinitis: Secondary | ICD-10-CM | POA: Insufficient documentation

## 2021-08-01 DIAGNOSIS — R053 Chronic cough: Secondary | ICD-10-CM

## 2021-08-01 DIAGNOSIS — T781XXA Other adverse food reactions, not elsewhere classified, initial encounter: Secondary | ICD-10-CM

## 2021-08-01 DIAGNOSIS — J310 Chronic rhinitis: Secondary | ICD-10-CM

## 2021-08-01 MED ORDER — MONTELUKAST SODIUM 10 MG PO TABS: 10.0000 mg | ORAL_TABLET | Freq: Every day | ORAL | 2 refills | Status: DC

## 2021-08-01 MED ORDER — ALBUTEROL SULFATE HFA 108 (90 BASE) MCG/ACT IN AERS: 2.0000 | INHALATION_SPRAY | Freq: Four times a day (QID) | RESPIRATORY_TRACT | 2 refills | Status: AC | PRN

## 2021-08-01 MED ORDER — FLUTICASONE PROPIONATE HFA 110 MCG/ACT IN AERO: 2.0000 | INHALATION_SPRAY | Freq: Two times a day (BID) | RESPIRATORY_TRACT | 3 refills | Status: DC

## 2021-08-01 NOTE — Patient Instructions (Addendum)
Chronic Rhinitis -seasonal and perennial allergic rhinitis: ?- allergy testing today was positive to grass pollen, tree pollen, indoor and outdoor molds, dust mites ?- allergen avoidance as below ?- consider allergy shots as long term control of your symptoms by teaching your immune system to be more tolerant of your allergy triggers ?- Start Nasal Steroid Spray: Options include Flonase (fluticasone), Nasocort (triamcinolone), Nasonex (mometasome) 1- 2 sprays in each nostril daily (can buy over-the-counter if not covered by insurance)  Best results if used daily. ?- Start Singulair (Montelukast) 10mg  nightly. ?- Start over the counter antihistamine daily or daily as needed.   ?-Your options include Zyrtec (Cetirizine) 10mg , Claritin (Loratadine) 10mg , Allegra (Fexofenadine) 180mg , or Xyzal (Levocetirinze) 5mg  ? ?Pollen food allergy syndrome:  ?- today's skin testing was negative to pineapple ?- These symptoms are typically not life-threatening and are because of a cross reaction between a pollen you are allergic to, and to a protein in specific foods (such as fresh fruits, vegetables, and nuts). ?- If you can eat these things and tolerate the symptoms, it is fine to continue to do so.  If not, you may avoid these fresh fruits and vegetables.   ?- Heating these foods, buying them canned, and peeling these foods should allow them to be consumed without symptoms or with less symptoms. ? ?Chronic cough: ?- your lung testing today looked great, did show some improvement with bronchodilator but not significant improvement, asymptomatic during testing ?- Controller Inhaler: Start Flovent 110 2 puffs twice a day; This Should Be Used Everyday ?- Rinse mouth out after use ?- Rescue Inhaler: Albuterol (Proair/Ventolin) 2 puffs . Use  every 4-6 hours as needed for chest tightness, wheezing, or coughing.  Can also use 15 minutes prior to exercise if you have symptoms with activity. ?- Cough is not controlled if: ? - Symptoms are  occurring >2 times a week OR ? - >2 times a month nighttime awakenings ? - You are requiring systemic steroids (prednisone/steroid injections) more than once per year ? - Your require hospitalization for your asthma. ? - Please call the clinic to schedule a follow up if these symptoms arise ? ?Follow-up in 6-8 weeks, sooner if needed.  ?It was a pleasure meeting you in clinic today! ? ?Reducing Pollen Exposure ? ?The American Academy of Allergy, Asthma and Immunology suggests the following steps to reduce your exposure to pollen during allergy seasons. ?   ?Do not hang sheets or clothing out to dry; pollen may collect on these items. ?Do not mow lawns or spend time around freshly cut grass; mowing stirs up pollen. ?Keep windows closed at night.  Keep car windows closed while driving. ?Minimize morning activities outdoors, a time when pollen counts are usually at their highest. ?Stay indoors as much as possible when pollen counts or humidity is high and on windy days when pollen tends to remain in the air longer. ?Use air conditioning when possible.  Many air conditioners have filters that trap the pollen spores. ?Use a HEPA room air filter to remove pollen form the indoor air you breathe. ? ?DUST MITE AVOIDANCE MEASURES: ? ?There are three main measures that need and can be taken to avoid house dust mites: ? ?Reduce accumulation of dust in general ?-reduce furniture, clothing, carpeting, books, stuffed animals, especially in bedroom ? ?Separate yourself from the dust ?-use pillow and mattress encasements (can be found at stores such as [employer] or online) ?-avoid direct exposure to air condition flow ?-use  a HEPA filter device, especially in the bedroom; you can also use a HEPA filter vacuum cleaner ?-wipe dust with a moist towel instead of a dry towel or broom when cleaning ? ?Decrease mites and/or their secretions ?-wash clothing and linen and stuffed animals at highest temperature possible, at least  every 2 weeks ?-stuffed animals can also be placed in a bag and put in a freezer overnight ? ?Despite the above measures, it is impossible to eliminate dust mites or their allergen completely from your home.  With the above measures the burden of mites in your home can be diminished, with the goal of minimizing your allergic symptoms.  Success will be reached only when implementing and using all means together. ? ?Control of Mold Allergen  ? ?Mold and fungi can grow on a variety of surfaces provided certain temperature and moisture conditions exist.  Outdoor molds grow on plants, decaying vegetation and soil.  The major outdoor mold, Alternaria and Cladosporium, are found in very high numbers during hot and dry conditions.  Generally, a late Summer - Fall peak is seen for common outdoor fungal spores.  Rain will temporarily lower outdoor mold spore count, but counts rise rapidly when the rainy period ends.  The most important indoor molds are Aspergillus and Penicillium.  Dark, humid and poorly ventilated basements are ideal sites for mold growth.  The next most common sites of mold growth are the bathroom and the kitchen. ? ?Outdoor (Seasonal) Mold Control ? ?Use air conditioning and keep windows closed ?Avoid exposure to decaying vegetation. ?Avoid leaf raking. ?Avoid grain handling. ?Consider wearing a face mask if working in moldy areas.  ? ? ?Indoor (Perennial) Mold Control  ? ?Maintain humidity below 50%. ?Clean washable surfaces with 5% bleach solution. ?Remove sources e.g. contaminated carpets. ? ? ? ? ? ? ?- ?

## 2021-08-01 NOTE — Progress Notes (Signed)
? ?NEW PATIENT ?Date of Service/Encounter:  08/01/21 ?Referring provider: Rema Fendt, NP ?Primary care provider: Rema Fendt, NP ? ?Subjective:  ?Marisa Fowler is a 26 y.o. female with a PMHx of tension headaches presenting today for evaluation of chronic rhinitis and cough. ?History obtained from: chart review and patient. ?  ?She had COVID-19 2 years ago and since stays with a chronic cough.  Gets a cold constantly, and usually goes to UC. Has been treated with singulair and prednisone which help.   ? ?Prior to this would have some mild sneezing runny nose, but only about twice a year with the season changes.  Fall to winter would be her worse time of year. ?She would feel better in a few days, and would not take any medications.  ?Never previously on allergy medications.  ? ?Since COVID, feels she gets a cold every month. ?She is not getting put on frequent antibiotics.  Symptoms tend to resolve on their own ?Worse symptoms from cold is a lingering cough, that is dry, worse at night.  No wheezing, but her chest does hurt.  Cough is worse with activity.   ?Has never tried an inhaler. ?Not working out because she feels very winded.  ? ?Denies any heartburn or reflux.  ?Her nostrils do feel like they are on fire or really dry.  ? ?Concern for Food Allergy:  ?Food of concern: pineapple and kiwi ?History of reaction: mouth and tongue feel itchy and like they are going to close, but they never close ?Previous allergy testing no ?Eats egg, dairy, wheat, soy, fish, shellfish, peanuts, tree nuts, sesame without reactions ?Carries an epinephrine autoinjector: no ? ?Other allergy screening: ?Medication allergy: no ?Hymenoptera allergy: no ?Urticaria: no ?Eczema:no ?History of recurrent infections suggestive of immunodeficency: no ?Vaccinations are up to date except covid-19.  ? ?Past Medical History: ?Past Medical History:  ?Diagnosis Date  ? Headache   ? Heart murmur   ? ?Medication List:  ?Current  Outpatient Medications  ?Medication Sig Dispense Refill  ? JUNEL FE 1/20 1-20 MG-MCG tablet Take 1 tablet by mouth daily.    ? montelukast (SINGULAIR) 10 MG tablet Take 1 tablet (10 mg total) by mouth at bedtime. 30 tablet 2  ? ?No current facility-administered medications for this visit.  ? ?Known Allergies:  ?Allergies  ?Allergen Reactions  ? Kiwi Extract Swelling  ? Pineapple Hives  ? ?Past Surgical History: ?Past Surgical History:  ?Procedure Laterality Date  ? no syrgical history    ? WISDOM TOOTH EXTRACTION    ? ?Family History: ?Family History  ?Problem Relation Age of Onset  ? Cancer Mother   ? Diabetes Mother   ? Hypertension Mother   ? Migraines Neg Hx   ? Allergic rhinitis Neg Hx   ? Angioedema Neg Hx   ? Asthma Neg Hx   ? Eczema Neg Hx   ? Immunodeficiency Neg Hx   ? Urticaria Neg Hx   ? 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  ? ?ROS:  ?All other systems negative except as noted per HPI. ? ?Objective:  ?Blood pressure 120/76, pulse 60, temperature (!) 97.5 ?F (36.4 ?C), temperature source Temporal, resp. rate 16, height 5' 2.6" (1.59 m), weight 115 lb 11.2 oz (  52.5 kg), SpO2 100 %. ?Body mass index is 20.76 kg/m?Marland Kitchen. ?Physical Exam: ? ?General Appearance:  Alert, cooperative, no distress, appears stated age  ?Head:  Normocephalic, without obvious abnormality, atraumatic  ?Eyes:  Conjunctiva clear, EOM's intact  ?Nose: Nares normal, hypertrophic turbinates, normal mucosa, no visible anterior polyps, and septum midline  ?Throat: Lips, tongue normal; teeth and gums normal, normal posterior oropharynx and tonsils 2+  ?Neck: Supple, symmetrical  ?Lungs:   clear to auscultation bilaterally, Respirations unlabored, no coughing  ?Heart:  regular rate and rhythm  and no murmur, Appears well perfused  ?Extremities: No edema  ?Skin: Skin color, texture, turgor normal, no rashes or lesions on visualized portions of skin  ?Neurologic: No gross deficits  ? ? ? ?Diagnostics: ?Spirometry:  ?Tracings reviewed. Her effort: Good reproducible efforts. ?FVC: 2.60L (pre), 2.69L  (post) ?FEV1: 2.32L, 85% predicted (pre), 2.41L, 89% predicted (post) ?FEV1/FVC ratio: 102% (pre), 103% (post) ?Interpretation: Spirometry consistent with normal pattern with out significant bronchodilator response ? ?Skin Testing: Environmental allergy panel and select foods. ? Adequate controls. ?Results discussed with patient/family. ? Airborne Adult Perc - 08/01/21 1541   ? ? Time Antigen Placed 0340   ? Allergen Manufacturer Waynette ButteryGreer   ? Location Back   ? Number of Test 59   ? 1. Control-Buffer 50% Glycerol Negative   ? 2. Control-Histamine 1 mg/ml 3+   ? 3. Albumin saline Negative   ? 4. Bahia Negative   ? 5. [place] Negative   ? 6. Johnson Negative   ? 7. Kentucky Blue Negative   ? 8. Meadow Fescue Negative   ? 9. Perennial Rye Negative   ? 10. Sweet Vernal 2+   ? 11. Timothy Negative   ? 12. Cocklebur Negative   ? 13. Burweed Marshelder Negative   ? 14. Ragweed, short Negative   ? 15. Ragweed, Giant Negative   ? 16. Plantain,  English Negative   ? 17. Lamb's Quarters Negative   ? 18. Sheep Sorrell Negative   ? 19. Rough Pigweed Negative   ? 20. Marsh Elder, Rough Negative   ? 21. Mugwort, Common Negative   ? 22. Ash mix Negative   ? 23. Charletta CousinBirch mix Negative   ? 24. Beech American Negative   ? 25. Box, Elder Negative   ? 26. Cedar, red Negative   ? 27. Cottonwood, [place]astern Negative   ? 28. Elm mix Negative   ? 29. Hickory Negative   ? 30. Maple mix Negative   ? 31. Oak, [place] mix 3+   ? 32. Pecan Pollen 2+   ? 33. Pine mix 3+   ? 34. Sycamore Eastern 3+   ? 35. Walnut, Black Pollen Negative   ? 36. Alternaria alternata Negative   ? 37. Cladosporium Herbarum Negative   ? 38. Aspergillus mix Negative   ? 39.  Penicillium mix Negative   ? 40. Bipolaris sorokiniana (Helminthosporium) Negative   ? 41. Drechslera spicifera (Curvularia) 3+   ? 42. Mucor plumbeus 3+   ? 43. Fusarium moniliforme Negative   ? 44. Aureobasidium pullulans (pullulara) Negative   ? 45. Rhizopus oryzae Negative   ? 46. Botrytis cinera Negative   ? 47. Epicoccum nigrum Negative   ? 48. Phoma betae Negative   ? 49. Candida Albicans Negative   ? 50. Trichophyton mentagrophytes Negative   ? 51. Mite, D Farinae  5,000 AU/ml 3+   ? 52. Mite, D Pteronyssinus  5,000 AU/ml Negative   ? 53. Cat  Hair 10,000 BAU/ml Negative   ? 54.  Dog Epithelia Negative   ? 55. Mixed Feathers Negative   ? 56. Horse Epithelia Negative   ? 57. Cockroach, [place] Negative   ? 58. Mouse Negative   ? 59. Tobacco Leaf Negative   ? ?  ?  ? ?  ? ? Intradermal - 08/01/21 1615   ? ? Time Antigen Placed 1615   ? Allergen Manufacturer Other   ? Location Arm   ? Number of Test 11   ? Control Negative   ? [place] Negative   ? Johnson 3+   ? Ragweed mix Negative   ? Weed mix Negative   ? Mold 1 Negative   ? Mold 2 3+   ? Mold 4 3+   ? Cat Negative   ? Dog Negative   ? Cockroach Negative   ? ?  ?  ? ?  ? ? Food Adult Perc - 08/01/21 1500   ? ? Time Antigen Placed 0340   ? Allergen Manufacturer Waynette Buttery   ? Location Back   ? Number of allergen test 1   ? 63. Pineapple Negative   ? ?  ?  ? ?  ? ? ?Allergy testing results were read and interpreted by myself, documented by clinical staff. ? ?Assessment and Plan  ?Patient with recurrent upper respiratory symptoms which have been worsening since having COVID 2 years ago.  Allergy testing today did reveal both seasonal and perennial allergens, so we will trial allergy medications to see if this improves her recurrent bouts of symptoms.  Additionally she is having recurrent cough which last for weeks several times a month.  While her spirometry is not consistent with asthma, she has improved significantly with prednisone and Singulair in the past.  We  will trial ICS. ? ?Chronic Rhinitis -seasonal and perennial allergic rhinitis: ?- allergy testing today was positive to grass pollen, tree pollen, indoor and outdoor molds, dust mites ?- allergen avoidance as belo

## 2021-08-25 ENCOUNTER — Encounter: Payer: Self-pay | Admitting: Family

## 2021-08-26 NOTE — Telephone Encounter (Signed)
Spoke to pt advised that she would need appt, declined 1:20 appt offered today w/[facility]

## 2021-10-16 ENCOUNTER — Encounter: Payer: Self-pay | Admitting: Family

## 2021-10-16 NOTE — Telephone Encounter (Signed)
Seems some of her screenshots are not fully showing. From what I can see her LDL (also known as "bad cholesterol") is low. This is good as higher numbers are usually associated with increased risk of acute or chronic illnesses. Triglycerides (another form of cholesterol) mildly higher than normal. Recommendation to consider eating more fruits, vegetables, and lean baked meats such as chicken or fish. Moderate intensity exercise at least 150 minutes as tolerated per week may help as well. Recheck fasting cholesterol levels in 3 months or sooner if needed.   Glucose slightly higher than normal. This may be due to if patient was not fasting. However, her hemoglobin A1c is not in the prediabetic/diabetic range so no immediate concerns.   Any further questions or concerns please schedule appointment.

## 2022-02-24 ENCOUNTER — Telehealth: Payer: BC Managed Care – PPO

## 2022-02-28 ENCOUNTER — Telehealth: Payer: BC Managed Care – PPO | Admitting: Physician Assistant

## 2022-02-28 DIAGNOSIS — J019 Acute sinusitis, unspecified: Secondary | ICD-10-CM

## 2022-02-28 DIAGNOSIS — B9689 Other specified bacterial agents as the cause of diseases classified elsewhere: Secondary | ICD-10-CM

## 2022-02-28 MED ORDER — AMOXICILLIN-POT CLAVULANATE 875-125 MG PO TABS: 1.0000 | ORAL_TABLET | Freq: Two times a day (BID) | ORAL | 0 refills | Status: DC

## 2022-02-28 NOTE — Progress Notes (Signed)
Virtual Visit Consent   Marisa Fowler, you are scheduled for a virtual visit with a [facility] provider today. Just as with appointments in the office, your consent must be obtained to participate. Your consent will be active for this visit and any virtual visit you may have with one of our providers in the next 365 days. If you have a MyChart account, a copy of this consent can be sent to you electronically.  As this is a virtual visit, video technology does not allow for your provider to perform a traditional examination. This may limit your provider's ability to fully assess your condition. If your provider identifies any concerns that need to be evaluated in person or the need to arrange testing (such as labs, EKG, etc.), we will make arrangements to do so. Although advances in technology are sophisticated, we cannot ensure that it will always work on either your end or our end. If the connection with a video visit is poor, the visit may have to be switched to a telephone visit. With either a video or telephone visit, we are not always able to ensure that we have a secure connection.  By engaging in this virtual visit, you consent to the provision of healthcare and authorize for your insurance to be billed (if applicable) for the services provided during this visit. Depending on your insurance coverage, you may receive a charge related to this service.  I need to obtain your verbal consent now. Are you willing to proceed with your visit today? Marisa Fowler has provided verbal consent on 02/28/2022 for a virtual visit (video or telephone). Margaretann Loveless, PA-C  Date: 02/28/2022 10:55 AM  Virtual Visit via Video Note   I, Margaretann Loveless, connected with  Marisa Fowler  ([id number], 10/16/1995) on 02/28/22 at 10:45 AM EST by a video-enabled telemedicine application and verified that I am speaking with the correct person using two identifiers.  Location: Patient:  Virtual Visit Location Patient: Home Provider: Virtual Visit Location Provider: Home Office   I discussed the limitations of evaluation and management by telemedicine and the availability of in person appointments. The patient expressed understanding and agreed to proceed.    History of Present Illness: Marisa Fowler is a 26 y.o. who identifies as a female who was assigned female at birth, and is being seen today for possible sinus infection.  HPI: Sinusitis This is a new problem. The current episode started 1 to 4 weeks ago. The problem has been gradually worsening since onset. There has been no fever. Associated symptoms include congestion, coughing (started with cough over 1.5 weeks ago, now improved and having only sinus congestion), ear pain (ear pain with blowing nose), headaches and sinus pressure. Pertinent negatives include no chills, hoarse voice or sore throat. Treatments tried: mucinex. The treatment provided no relief.    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  Problems:  Patient Active Problem List   Diagnosis Date Noted   Seasonal and perennial allergic rhinitis 08/01/2021   Pollen-food allergy 08/01/2021   Dysmenorrhea 05/16/2020   Menometrorrhagia 05/16/2020   Tension headache, chronic 02/01/2019   Acute bronchitis 06/28/2016   Postconcussion syndrome 06/20/2015   Headache 06/25/2015   Head injury due to trauma 06-25-15   Memory changes 06-25-15   Concentration deficit 06-25-15   Unhealthy sleep habit 2015-06-25   Stress 2015-06-25   Death of family member 06/25/2015   DIZZINESS 03/23/2009    Allergies:  No Known Allergies  Medications:  Current Outpatient  Medications:    amoxicillin-clavulanate (AUGMENTIN) 875-125 MG tablet, Take 1 tablet by mouth 2 (two) times daily., Disp: 14 tablet, Rfl: 0   albuterol (VENTOLIN HFA) 108 (90 Base) MCG/ACT inhaler, Inhale 2 puffs into the lungs every 6 (six) hours as needed for wheezing or shortness of breath., Disp: 8 g, Rfl:  2   fluticasone (FLOVENT HFA) 110 MCG/ACT inhaler, Inhale 2 puffs into the lungs in the morning and at bedtime., Disp: 1 each, Rfl: 3   JUNEL FE 1/20 1-20 MG-MCG tablet, Take 1 tablet by mouth daily., Disp: , Rfl:    montelukast (SINGULAIR) 10 MG tablet, Take 1 tablet (10 mg total) by mouth at bedtime., Disp: 30 tablet, Rfl: 2  Observations/Objective: Patient is well-developed, well-nourished in no acute distress.  Resting comfortably at home.  Head is normocephalic, atraumatic.  No labored breathing.  Speech is clear and coherent with logical content.  Patient is alert and oriented at baseline.    Assessment and Plan: 1. Acute bacterial sinusitis - amoxicillin-clavulanate (AUGMENTIN) 875-125 MG tablet; Take 1 tablet by mouth 2 (two) times daily.  Dispense: 14 tablet; Refill: 0  - Worsening symptoms that have not responded to OTC medications.  - Will give Augmentin - Continue allergy medications.  - Steam and humidifier can help - Stay well hydrated and get plenty of rest.  - Seek in person evaluation if no symptom improvement or if symptoms worsen   Follow Up Instructions: I discussed the assessment and treatment plan with the patient. The patient was provided an opportunity to ask questions and all were answered. The patient agreed with the plan and demonstrated an understanding of the instructions.  A copy of instructions were sent to the patient via MyChart unless otherwise noted below.     The patient was advised to call back or seek an in-person evaluation if the symptoms worsen or if the condition fails to improve as anticipated.  Time:  I spent 8 minutes with the patient via telehealth technology discussing the above problems/concerns.    Margaretann Loveless, PA-C

## 2022-02-28 NOTE — Patient Instructions (Signed)
Alaja Chantele Hellums, thank you for joining Margaretann Loveless, PA-C for today's virtual visit.  While this provider is not your primary care provider (PCP), if your PCP is located in our provider database this encounter information will be shared with them immediately following your visit.   A [facility] MyChart account gives you access to today's visit and all your visits, tests, and labs performed at [facility] " click here if you don't have a [facility] MyChart account or go to [email or web address]  Consent: (Patient) Minette Headland Caterino provided verbal consent for this virtual visit at the beginning of the encounter.  Current Medications:  Current Outpatient Medications:    amoxicillin-clavulanate (AUGMENTIN) 875-125 MG tablet, Take 1 tablet by mouth 2 (two) times daily., Disp: 14 tablet, Rfl: 0   albuterol (VENTOLIN HFA) 108 (90 Base) MCG/ACT inhaler, Inhale 2 puffs into the lungs every 6 (six) hours as needed for wheezing or shortness of breath., Disp: 8 g, Rfl: 2   fluticasone (FLOVENT HFA) 110 MCG/ACT inhaler, Inhale 2 puffs into the lungs in the morning and at bedtime., Disp: 1 each, Rfl: 3   JUNEL FE 1/20 1-20 MG-MCG tablet, Take 1 tablet by mouth daily., Disp: , Rfl:    montelukast (SINGULAIR) 10 MG tablet, Take 1 tablet (10 mg total) by mouth at bedtime., Disp: 30 tablet, Rfl: 2   Medications ordered in this encounter:  Meds ordered this encounter  Medications   amoxicillin-clavulanate (AUGMENTIN) 875-125 MG tablet    Sig: Take 1 tablet by mouth 2 (two) times daily.    Dispense:  14 tablet    Refill:  0    Order Specific Question:   Supervising Provider    Answer:   Merrilee Jansky [id number]     *If you need refills on other medications prior to your next appointment, please contact your pharmacy*  Follow-Up: Call back or seek an in-person evaluation if the symptoms worsen or if the condition fails to improve as anticipated.  [facility]  Virtual Care [phone]  Other Instructions  Sinus Infection, Adult A sinus infection, also called sinusitis, is inflammation of your sinuses. Sinuses are hollow spaces in the bones around your face. Your sinuses are located: Around your eyes. In the middle of your forehead. Behind your nose. In your cheekbones. Mucus normally drains out of your sinuses. When your nasal tissues become inflamed or swollen, mucus can become trapped or blocked. This allows bacteria, viruses, and fungi to grow, which leads to infection. Most infections of the sinuses are caused by a virus. A sinus infection can develop quickly. It can last for up to 4 weeks (acute) or for more than 12 weeks (chronic). A sinus infection often develops after a cold. What are the causes? This condition is caused by anything that creates swelling in the sinuses or stops mucus from draining. This includes: Allergies. Asthma. Infection from bacteria or viruses. Deformities or blockages in your nose or sinuses. Abnormal growths in the nose (nasal polyps). Pollutants, such as chemicals or irritants in the air. Infection from fungi. This is rare. What increases the risk? You are more likely to develop this condition if you: Have a weak body defense system (immune system). Do a lot of swimming or diving. Overuse nasal sprays. Smoke. What are the signs or symptoms? The main symptoms of this condition are pain and a feeling of pressure around the affected sinuses. Other symptoms include: Stuffy nose or congestion that makes it difficult to breathe through  your nose. Thick yellow or greenish drainage from your nose. Tenderness, swelling, and warmth over the affected sinuses. A cough that may get worse at night. Decreased sense of smell and taste. Extra mucus that collects in the throat or the back of the nose (postnasal drip) causing a sore throat or bad breath. Tiredness (fatigue). Fever. How is this diagnosed? This  condition is diagnosed based on: Your symptoms. Your medical history. A physical exam. Tests to find out if your condition is acute or chronic. This may include: Checking your nose for nasal polyps. Viewing your sinuses using a device that has a light (endoscope). Testing for allergies or bacteria. Imaging tests, such as an MRI or CT scan. In rare cases, a bone biopsy may be done to rule out more serious types of fungal sinus disease. How is this treated? Treatment for a sinus infection depends on the cause and whether your condition is chronic or acute. If caused by a virus, your symptoms should go away on their own within 10 days. You may be given medicines to relieve symptoms. They include: Medicines that shrink swollen nasal passages (decongestants). A spray that eases inflammation of the nostrils (topical intranasal corticosteroids). Rinses that help get rid of thick mucus in your nose (nasal saline washes). Medicines that treat allergies (antihistamines). Over-the-counter pain relievers. If caused by bacteria, your health care provider may recommend waiting to see if your symptoms improve. Most bacterial infections will get better without antibiotic medicine. You may be given antibiotics if you have: A severe infection. A weak immune system. If caused by narrow nasal passages or nasal polyps, surgery may be needed. Follow these instructions at home: Medicines Take, use, or apply over-the-counter and prescription medicines only as told by your health care provider. These may include nasal sprays. If you were prescribed an antibiotic medicine, take it as told by your health care provider. Do not stop taking the antibiotic even if you start to feel better. Hydrate and humidify  Drink enough fluid to keep your urine pale yellow. Staying hydrated will help to thin your mucus. Use a cool mist humidifier to keep the humidity level in your home above 50%. Inhale steam for 10-15 minutes,  3-4 times a day, or as told by your health care provider. You can do this in the bathroom while a hot shower is running. Limit your exposure to cool or dry air. Rest Rest as much as possible. Sleep with your head raised (elevated). Make sure you get enough sleep each night. General instructions  Apply a warm, moist washcloth to your face 3-4 times a day or as told by your health care provider. This will help with discomfort. Use nasal saline washes as often as told by your health care provider. Wash your hands often with soap and water to reduce your exposure to germs. If soap and water are not available, use hand sanitizer. Do not smoke. Avoid being around people who are smoking (secondhand smoke). Keep all follow-up visits. This is important. Contact a health care provider if: You have a fever. Your symptoms get worse. Your symptoms do not improve within 10 days. Get help right away if: You have a severe headache. You have persistent vomiting. You have severe pain or swelling around your face or eyes. You have vision problems. You develop confusion. Your neck is stiff. You have trouble breathing. These symptoms may be an emergency. Get help right away. Call 911. Do not wait to see if the symptoms will go  away. Do not drive yourself to the hospital. Summary A sinus infection is soreness and inflammation of your sinuses. Sinuses are hollow spaces in the bones around your face. This condition is caused by nasal tissues that become inflamed or swollen. The swelling traps or blocks the flow of mucus. This allows bacteria, viruses, and fungi to grow, which leads to infection. If you were prescribed an antibiotic medicine, take it as told by your health care provider. Do not stop taking the antibiotic even if you start to feel better. Keep all follow-up visits. This is important. This information is not intended to replace advice given to you by your health care provider. Make sure you  discuss any questions you have with your health care provider. Document Revised: 02/26/2021 Document Reviewed: 02/26/2021 Elsevier Patient Education  2023 Elsevier Inc.    If you have been instructed to have an in-person evaluation today at a local Urgent Care facility, please use the link below. It will take you to a list of all of our available [facility], including address, phone number and hours of operation. Please do not delay care.  [facility]  If you or a family member do not have a primary care provider, use the link below to schedule a visit and establish care. When you choose a [facility] primary care physician or advanced practice provider, you gain a long-term partner in health. Find a Primary Care Provider  Learn more about [facility]'s in-office and virtual care options: [facility] - Get Care Now

## 2022-05-16 ENCOUNTER — Encounter: Payer: Self-pay | Admitting: Family

## 2022-10-06 ENCOUNTER — Telehealth: Payer: BC Managed Care – PPO | Admitting: Family Medicine

## 2022-10-06 DIAGNOSIS — J039 Acute tonsillitis, unspecified: Secondary | ICD-10-CM

## 2022-10-06 MED ORDER — AMOXICILLIN 500 MG PO TABS: 500.0000 mg | ORAL_TABLET | Freq: Two times a day (BID) | ORAL | 0 refills | Status: AC

## 2022-10-06 NOTE — Patient Instructions (Signed)
  Ashima Chantele Arya, thank you for joining Freddy Finner, NP for today's virtual visit.  While this provider is not your primary care provider (PCP), if your PCP is located in our provider database this encounter information will be shared with them immediately following your visit.   A [facility] MyChart account gives you access to today's visit and all your visits, tests, and labs performed at [facility] " click here if you don't have a [facility] MyChart account or go to [email or web address]  Consent: (Patient) Marisa Fowler provided verbal consent for this virtual visit at the beginning of the encounter.  Current Medications:  Current Outpatient Medications:    amoxicillin (AMOXIL) 500 MG tablet, Take 1 tablet (500 mg total) by mouth 2 (two) times daily for 10 days., Disp: 20 tablet, Rfl: 0   albuterol (VENTOLIN HFA) 108 (90 Base) MCG/ACT inhaler, Inhale 2 puffs into the lungs every 6 (six) hours as needed for wheezing or shortness of breath., Disp: 8 g, Rfl: 2   fluticasone (FLOVENT HFA) 110 MCG/ACT inhaler, Inhale 2 puffs into the lungs in the morning and at bedtime., Disp: 1 each, Rfl: 3   JUNEL FE 1/20 1-20 MG-MCG tablet, Take 1 tablet by mouth daily., Disp: , Rfl:    montelukast (SINGULAIR) 10 MG tablet, Take 1 tablet (10 mg total) by mouth at bedtime., Disp: 30 tablet, Rfl: 2   Medications ordered in this encounter:  Meds ordered this encounter  Medications   amoxicillin (AMOXIL) 500 MG tablet    Sig: Take 1 tablet (500 mg total) by mouth 2 (two) times daily for 10 days.    Dispense:  20 tablet    Refill:  0    Order Specific Question:   Supervising Provider    Answer:   Merrilee Jansky [id number]     *If you need refills on other medications prior to your next appointment, please contact your pharmacy*  Follow-Up: Call back or seek an in-person evaluation if the symptoms worsen or if the condition fails to improve as anticipated.  [facility] [phone]  Other Instructions  - May use tylenol and ibuprofen alternating every 3-4 hours as needed for pain, fevers, body aches - Salt water gargles - Hydration and voice rest - Seek in person evaluation if worsening or fails to improve   If you have been instructed to have an in-person evaluation today at a local Urgent Care facility, please use the link below. It will take you to a list of all of our available [facility], including address, phone number and hours of operation. Please do not delay care.  [facility]  If you or a family member do not have a primary care provider, use the link below to schedule a visit and establish care. When you choose a [facility] primary care physician or advanced practice provider, you gain a long-term partner in health. Find a Primary Care Provider  Learn more about [facility]'s in-office and virtual care options:  - Get Care Now

## 2022-10-06 NOTE — Progress Notes (Signed)
Virtual Visit Consent   [name], you are scheduled for a virtual visit with a [facility] provider today. Just as with appointments in the office, your consent must be obtained to participate. Your consent will be active for this visit and any virtual visit you may have with one of our providers in the next 365 days. If you have a MyChart account, a copy of this consent can be sent to you electronically.  As this is a virtual visit, video technology does not allow for your provider to perform a traditional examination. This may limit your provider's ability to fully assess your condition. If your provider identifies any concerns that need to be evaluated in person or the need to arrange testing (such as labs, EKG, etc.), we will make arrangements to do so. Although advances in technology are sophisticated, we cannot ensure that it will always work on either your end or our end. If the connection with a video visit is poor, the visit may have to be switched to a telephone visit. With either a video or telephone visit, we are not always able to ensure that we have a secure connection.  By engaging in this virtual visit, you consent to the provision of healthcare and authorize for your insurance to be billed (if applicable) for the services provided during this visit. Depending on your insurance coverage, you may receive a charge related to this service.  I need to obtain your verbal consent now. Are you willing to proceed with your visit today? [name] has provided verbal consent on 10/06/2022 for a virtual visit (video or telephone). Freddy Finner, NP  Date: 10/06/2022 3:00 PM  Virtual Visit via Video Note   I, Freddy Finner, connected with  Stella Verdin  ([id number], Aug 07, 1995) on 10/06/22 at  3:00 PM EDT by a video-enabled telemedicine application and verified that I am speaking with the correct person using two identifiers.  Location: Patient: Virtual Visit  Location Patient: Home Provider: Virtual Visit Location Provider: Home Office   I discussed the limitations of evaluation and management by telemedicine and the availability of in person appointments. The patient expressed understanding and agreed to proceed.    History of Present Illness: Marisa Fowler is a 27 y.o. 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   Dysmenorrhea 05/16/2020   Menometrorrhagia 05/16/2020   Tension headache, chronic 02/01/2019   Acute bronchitis 06/28/2016   Postconcussion syndrome 06/20/2015   Headache Jun 20, 2015   Head injury due to trauma June 20, 2015   Memory changes 20-Jun-2015   Concentration deficit 2015/06/20   Unhealthy sleep habit 20-Jun-2015   Stress 20-Jun-2015   Death of family member June 20, 2015   DIZZINESS 03/23/2009    Allergies: No Known Allergies Medications:  Current Outpatient Medications:    albuterol (VENTOLIN HFA) 108 (90 Base) MCG/ACT inhaler, Inhale 2 puffs into the lungs every 6 (six) hours as needed for wheezing or  shortness of breath., Disp: 8 g, Rfl: 2   amoxicillin-clavulanate (AUGMENTIN) 875-125 MG tablet, Take 1 tablet by mouth 2 (two) times daily., Disp: 14 tablet, Rfl: 0   fluticasone (FLOVENT HFA) 110 MCG/ACT inhaler, Inhale 2 puffs into the lungs in the morning and at bedtime., Disp: 1 each, Rfl: 3   JUNEL FE 1/20 1-20 MG-MCG tablet, Take 1 tablet by mouth daily., Disp: , Rfl:     montelukast (SINGULAIR) 10 MG tablet, Take 1 tablet (10 mg total) by mouth at bedtime., Disp: 30 tablet, Rfl: 2  Observations/Objective: Patient is well-developed, well-nourished in no acute distress.  Resting comfortably  at home.  Head is normocephalic, atraumatic.  No labored breathing.  Speech is clear and coherent with logical content.  Patient is alert and oriented at baseline.    Assessment and Plan:  1. Tonsillitis  - amoxicillin (AMOXIL) 500 MG tablet; Take 1 tablet (500 mg total) by mouth 2 (two) times daily for 10 days.  Dispense: 20 tablet; Refill: 0   -Suspect bacterial pharyngitis - Discussed OTC management - May use tylenol and ibuprofen alternating every 3-4 hours as needed for pain, fevers, body aches - Salt water gargles - Hydration and voice rest - Seek in person evaluation if worsening or fails to improve  Reviewed side effects, risks and benefits of medication.    Patient acknowledged agreement and understanding of the plan.   Past Medical, Surgical, Social History, Allergies, and Medications have been Reviewed.   Follow Up Instructions: I discussed the assessment and treatment plan with the patient. The patient was provided an opportunity to ask questions and all were answered. The patient agreed with the plan and demonstrated an understanding of the instructions.  A copy of instructions were sent to the patient via MyChart unless otherwise noted below.     The patient was advised to call back or seek an in-person evaluation if the symptoms worsen or if the condition fails to improve as anticipated.  Time:  I spent 10 minutes with the patient via telehealth technology discussing the above problems/concerns.    Freddy Finner, NP

## 2023-05-09 ENCOUNTER — Emergency Department (HOSPITAL_BASED_OUTPATIENT_CLINIC_OR_DEPARTMENT_OTHER)
Admission: EM | Admit: 2023-05-09 | Discharge: 2023-05-09 | Disposition: A | Discharge status: Home / Self Care | Payer: 59 | Attending: Emergency Medicine | Admitting: Emergency Medicine

## 2023-05-09 ENCOUNTER — Other Ambulatory Visit: Payer: Self-pay

## 2023-05-09 ENCOUNTER — Encounter (HOSPITAL_BASED_OUTPATIENT_CLINIC_OR_DEPARTMENT_OTHER): Payer: Self-pay

## 2023-05-09 DIAGNOSIS — R519 Headache, unspecified: Secondary | ICD-10-CM | POA: Insufficient documentation

## 2023-05-09 LAB — CBC WITH DIFFERENTIAL/PLATELET
Abs Immature Granulocytes: 0.01 10*3/uL (ref 0.00–0.07)
Basophils Absolute: 0.1 10*3/uL (ref 0.0–0.1)
Basophils Relative: 1 %
Eosinophils Absolute: 0.1 10*3/uL (ref 0.0–0.5)
Eosinophils Relative: 2 %
HCT: 34.3 % — ABNORMAL LOW (ref 36.0–46.0)
Hemoglobin: 10.8 g/dL — ABNORMAL LOW (ref 12.0–15.0)
Immature Granulocytes: 0 %
Lymphocytes Relative: 41 %
Lymphs Abs: 2 10*3/uL (ref 0.7–4.0)
MCH: 22.2 pg — ABNORMAL LOW (ref 26.0–34.0)
MCHC: 31.5 g/dL (ref 30.0–36.0)
MCV: 70.4 fL — ABNORMAL LOW (ref 80.0–100.0)
Monocytes Absolute: 0.3 10*3/uL (ref 0.1–1.0)
Monocytes Relative: 7 %
Neutro Abs: 2.4 10*3/uL (ref 1.7–7.7)
Neutrophils Relative %: 49 %
Platelets: 359 10*3/uL (ref 150–400)
RBC: 4.87 MIL/uL (ref 3.87–5.11)
RDW: 15.9 % — ABNORMAL HIGH (ref 11.5–15.5)
WBC: 4.9 10*3/uL (ref 4.0–10.5)
nRBC: 0 % (ref 0.0–0.2)

## 2023-05-09 LAB — BASIC METABOLIC PANEL
Anion gap: 8 (ref 5–15)
BUN: 12 mg/dL (ref 6–20)
CO2: 26 mmol/L (ref 22–32)
Calcium: 9.2 mg/dL (ref 8.9–10.3)
Chloride: 106 mmol/L (ref 98–111)
Creatinine, Ser: 0.91 mg/dL (ref 0.44–1.00)
GFR, Estimated: >60 mL/min (ref >60)
Glucose, Bld: 98 mg/dL (ref 70–99)
Potassium: 3.4 mmol/L — ABNORMAL LOW (ref 3.5–5.1)
Sodium: 140 mmol/L (ref 135–145)

## 2023-05-09 LAB — HCG, QUANTITATIVE, PREGNANCY: hCG, Beta Chain, Quant, S: <1 m[IU]/mL (ref <5)

## 2023-05-09 MED ORDER — KETOROLAC TROMETHAMINE 15 MG/ML IJ SOLN
15.0000 mg | Freq: Once | INTRAMUSCULAR | Status: AC
  Administered 2023-05-09: 15 mg via INTRAVENOUS
  Filled 2023-05-09: qty 1

## 2023-05-09 MED ORDER — ACETAMINOPHEN 500 MG PO TABS
1000.0000 mg | ORAL_TABLET | Freq: Once | ORAL | Status: AC
  Administered 2023-05-09: 1000 mg via ORAL
  Filled 2023-05-09: qty 2

## 2023-05-09 MED ORDER — METOCLOPRAMIDE HCL 5 MG/ML IJ SOLN
10.0000 mg | Freq: Once | INTRAMUSCULAR | Status: AC
  Administered 2023-05-09: 10 mg via INTRAVENOUS
  Filled 2023-05-09: qty 2

## 2023-05-09 NOTE — ED Notes (Signed)
She is easily aroused from light sleep and tells me that her pain is a "zero".

## 2023-05-09 NOTE — ED Triage Notes (Signed)
Patient arrives ambulatory to the ED with complaints of persistent headache x3 months. Patient states that the headaches are more focused on the right side of her head. She is also developing "brain fog" as well.

## 2023-05-09 NOTE — Discharge Instructions (Addendum)
Take Motrin Tylenol at home.  Please follow-up with your primary care doctor.  Your potassium is a little bit low but is likely not causing you problems. , I recommend eating some spinach, bananas, or blueberries.

## 2023-05-09 NOTE — ED Provider Notes (Signed)
Marisa Fowler EMERGENCY DEPARTMENT AT [facility] Provider Note   CSN: [id number] Arrival date & time: 05/09/23  1343     History  Chief Complaint  Patient presents with   Headache    Marisa Fowler is a 28 y.o. female.  Otherwise healthy 28 year old female here today for 3 months of intermittent headache.  No vomiting, not worse in the morning, no difficulty with vision or ambulation.  Followed up with her PCP who referred her to neurology, but appointment is not for the next 1 to 2 weeks.   Headache      Home Medications Prior to Admission medications   Medication Sig Start Date End Date Taking? Authorizing Provider  albuterol (VENTOLIN HFA) 108 (90 Base) MCG/ACT inhaler Inhale 2 puffs into the lungs every 6 (six) hours as needed for wheezing or shortness of breath. 08/01/21   Verlee Monte, MD  fluticasone (FLOVENT HFA) 110 MCG/ACT inhaler Inhale 2 puffs into the lungs in the morning and at bedtime. 08/01/21   Verlee Monte, MD  JUNEL FE 1/20 1-20 MG-MCG tablet Take 1 tablet by mouth daily. 03/18/20   [provider]  montelukast (SINGULAIR) 10 MG tablet Take 1 tablet (10 mg total) by mouth at bedtime. 08/01/21   Verlee Monte, MD      Allergies    Kiwi extract and Pineapple    Review of Systems   Review of Systems  Neurological:  Positive for headaches.    Physical Exam Updated Vital Signs BP 108/75 (BP Location: Right Arm)   Pulse 95   Temp 98.4 F (36.9 C)   Resp 16   Ht 5\' 3"  (1.6 m)   Wt 52.2 kg   SpO2 98%   BMI 20.37 kg/m  Physical Exam Vitals reviewed.  Cardiovascular:     Heart sounds: No murmur heard. Pulmonary:     Effort: Pulmonary effort is normal.  Abdominal:     General: There is no distension.  Musculoskeletal:        General: No swelling or tenderness.  Neurological:     Mental Status: She is alert and oriented to person, place, and time.     Cranial Nerves: No cranial nerve deficit or facial asymmetry.      Sensory: No sensory deficit.     Motor: No weakness.     Gait: Gait normal.     ED Results / Procedures / Treatments   Labs (all labs ordered are listed, but only abnormal results are displayed) Labs Reviewed  BASIC METABOLIC PANEL - Abnormal; Notable for the following components:      Result Value   Potassium 3.4 (*)    All other components within normal limits  CBC WITH DIFFERENTIAL/PLATELET - Abnormal; Notable for the following components:   Hemoglobin 10.8 (*)    HCT 34.3 (*)    MCV 70.4 (*)    MCH 22.2 (*)    RDW 15.9 (*)    All other components within normal limits  HCG, QUANTITATIVE, PREGNANCY    EKG None  Radiology No results found.  Procedures Procedures    Medications Ordered in ED Medications  metoCLOPramide (REGLAN) injection 10 mg (10 mg Intravenous Given 05/09/23 1527)  ketorolac (TORADOL) 15 MG/ML injection 15 mg (15 mg Intravenous Given 05/09/23 1528)  acetaminophen (TYLENOL) tablet 1,000 mg (1,000 mg Oral Given 05/09/23 1527)    ED Course/ Medical Decision Making/ A&P  Medical Decision Making 28 year old female here today for 3 months of headache.  Differential diagnoses include chronic headache, tension type headache, less likely migraine headache, considered intracranial hemorrhage, less likely pseudotumor.  Plan-patient with no neurological deficits, no red flag symptoms of headache.  Discussed imaging with the patient versus symptomatic management, continued outpatient follow-up.  Patient preferred to go to the outpatient follow-up route.  Check basic labs on the patient, mild hypokalemia.  Will counsel the patient on potassium foods.  Will discharge patient from emergency room.  Patient ambulatory at time of discharge.  Amount and/or Complexity of Data Reviewed Labs: ordered.  Risk OTC drugs. Prescription drug management.           Final Clinical Impression(s) / ED Diagnoses Final diagnoses:   Nonintractable headache, unspecified chronicity pattern, unspecified headache type    Rx / DC Orders ED Discharge Orders     None         Arletha Pili, DO 05/09/23 1638

## 2023-09-15 ENCOUNTER — Ambulatory Visit (INDEPENDENT_AMBULATORY_CARE_PROVIDER_SITE_OTHER): Payer: Self-pay | Admitting: Neurology

## 2023-09-15 ENCOUNTER — Encounter: Payer: Self-pay | Admitting: Neurology

## 2023-09-15 VITALS — BP 128/83 | HR 89 | Ht 63.0 in | Wt 119.8 lb

## 2023-09-15 DIAGNOSIS — R5383 Other fatigue: Secondary | ICD-10-CM

## 2023-09-15 DIAGNOSIS — R410 Disorientation, unspecified: Secondary | ICD-10-CM

## 2023-09-15 DIAGNOSIS — E519 Thiamine deficiency, unspecified: Secondary | ICD-10-CM | POA: Diagnosis not present

## 2023-09-15 DIAGNOSIS — E538 Deficiency of other specified B group vitamins: Secondary | ICD-10-CM

## 2023-09-15 DIAGNOSIS — R404 Transient alteration of awareness: Secondary | ICD-10-CM

## 2023-09-15 DIAGNOSIS — R413 Other amnesia: Secondary | ICD-10-CM | POA: Diagnosis not present

## 2023-09-15 NOTE — Patient Instructions (Addendum)
 A few labs today EEG in the office MRI of the brain w/seizure protocol, xanax
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
## 2023-09-16 ENCOUNTER — Ambulatory Visit: Payer: Self-pay | Admitting: Neurology

## 2023-09-19 LAB — B12 AND FOLATE PANEL
Folate: 13.9 ng/mL (ref >3.0)
Vitamin B-12: 457 pg/mL (ref 232–1245)

## 2023-09-19 LAB — METHYLMALONIC ACID, SERUM: Methylmalonic Acid: 165 nmol/L (ref 0–378)

## 2023-09-19 LAB — VITAMIN B1: Thiamine: 88.2 nmol/L (ref 66.5–200.0)

## 2023-09-19 LAB — TSH RFX ON ABNORMAL TO FREE T4: TSH: 0.913 u[IU]/mL (ref 0.450–4.500)

## 2023-09-23 ENCOUNTER — Other Ambulatory Visit: Payer: Self-pay | Admitting: Neurology

## 2023-09-23 ENCOUNTER — Ambulatory Visit (INDEPENDENT_AMBULATORY_CARE_PROVIDER_SITE_OTHER)

## 2023-09-23 ENCOUNTER — Telehealth: Payer: Self-pay | Admitting: Neurology

## 2023-09-23 ENCOUNTER — Encounter: Payer: Self-pay | Admitting: Neurology

## 2023-09-23 DIAGNOSIS — R410 Disorientation, unspecified: Secondary | ICD-10-CM

## 2023-09-23 DIAGNOSIS — R404 Transient alteration of awareness: Secondary | ICD-10-CM

## 2023-09-23 DIAGNOSIS — R413 Other amnesia: Secondary | ICD-10-CM

## 2023-09-23 MED ORDER — GADOBENATE DIMEGLUMINE 529 MG/ML IV SOLN
10.0000 mL | Freq: Once | INTRAVENOUS | Status: AC | PRN
  Administered 2023-09-23: 10 mL via INTRAVENOUS

## 2023-09-23 MED ORDER — ALPRAZOLAM 0.25 MG PO TABS: ORAL_TABLET | ORAL | 0 refills | Status: AC

## 2023-09-23 NOTE — Telephone Encounter (Signed)
 See other phone note. Rx was sent.

## 2023-09-23 NOTE — Telephone Encounter (Signed)
 Prescription sent 6 minutes ago.

## 2023-09-23 NOTE — Telephone Encounter (Signed)
 Spoke with pharmacy. They will watch for Rx and will fill for MRI this afternoon.

## 2023-09-23 NOTE — Telephone Encounter (Signed)
 I called pt and let her know that xanax was sent to her pharmacy. (I went over the prescription directions).  She will need driver.  She verbalized understanding.  She is to have this done here at [facility].

## 2023-09-23 NOTE — Telephone Encounter (Signed)
 Pt called stating that  MD   was to place order for pt to receive Xanax for MRI appt . Pt Pharmacy  states they have not receive or to call MD . Pt would like  that medication before appt  which is today at 3:30 .  Pt would like medications to   [id number] - [place], [place] - [address] Phone: [phone]  Fax: [phone]

## 2023-09-24 ENCOUNTER — Ambulatory Visit: Admitting: Neurology

## 2023-09-24 DIAGNOSIS — R410 Disorientation, unspecified: Secondary | ICD-10-CM | POA: Diagnosis not present

## 2023-09-24 DIAGNOSIS — R413 Other amnesia: Secondary | ICD-10-CM

## 2023-09-24 NOTE — Procedures (Signed)
    History:  28 year old woman with memory disturbance and disorientation   EEG classification: Awake and drowsy  Duration: 26 minutes   Technical aspects: This EEG study was done with scalp electrodes positioned according to the 10-20 International system of electrode placement. Electrical activity was reviewed with band pass filter of 1-70Hz , sensitivity of 7 uV/mm, display speed of 35mm/sec with a 60Hz  notched filter applied as appropriate. EEG data were recorded continuously and digitally stored.   Description of the recording: The background rhythms of this recording consists of a fairly well modulated medium amplitude alpha rhythm of 10 Hz that is reactive to eye opening and closure. Present in the anterior head region is a 15-20 Hz beta activity. Photic stimulation was performed, did not show any abnormalities. Hyperventilation was also performed, did not show any abnormalities. Drowsiness was manifested by background fragmentation. No abnormal epileptiform discharges seen during this recording. There was no focal slowing. There were no electrographic seizure identified.   Abnormality: None   Impression: This is a normal awake and drowsy EEG. No evidence of interictal epileptiform discharges. Normal EEGs, however, do not rule out epilepsy.    Marisa Buenaventura, MD [facility]

## 2023-12-16 ENCOUNTER — Ambulatory Visit: Admitting: Physical Therapy

## 2023-12-16 NOTE — Therapy (Deleted)
 OUTPATIENT PHYSICAL THERAPY FEMALE PELVIC EVALUATION   Patient Name: Marisa Fowler MRN: [id number] DOB:[birth year], 28 y.o., female Today's Date: 12/16/2023  END OF SESSION:   Past Medical History:  Diagnosis Date   Headache    Heart murmur    Past Surgical History:  Procedure Laterality Date   no syrgical history     WISDOM TOOTH EXTRACTION     Patient Active Problem List   Diagnosis Date Noted   Seasonal and perennial allergic rhinitis 08/01/2021   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   Dysmenorrhea 05/16/2020   Menometrorrhagia 05/16/2020   Tension headache, chronic 02/01/2019   Acute bronchitis 06/28/2016   Postconcussion syndrome 06/20/2015   Headache July 08, 2015   Head injury due to trauma 07-08-2015   Memory changes 07/08/15   Concentration deficit July 08, 2015   Unhealthy sleep habit Jul 08, 2015   Stress July 08, 2015   Death of family member July 08, 2015   DIZZINESS 03/23/2009    PCP: Lenon Nell SAILOR, FNP  REFERRING PROVIDER: Perri Bjork, PA-C   REFERRING DIAG: R10.2 (ICD-10-CM) - Pelvic and perineal pain   THERAPY DIAG:  No diagnosis found.  Rationale for Evaluation and Treatment: Rehabilitation  ONSET DATE: ***  SUBJECTIVE:                                                                                                                                                                                           SUBJECTIVE STATEMENT: *** Fluid intake:   PAIN:  Are you having pain? {yes/no:20286} NPRS scale: ***/10 Pain location: {pelvic pain location:27098}  Pain type: {type:313116} Pain description: {PAIN DESCRIPTION:21022940}   Aggravating factors: *** Relieving factors: ***  PRECAUTIONS: None  RED FLAGS: {PT Red Flags:29287}   WEIGHT BEARING RESTRICTIONS: No  FALLS:  Has patient fallen in last 6 months? {fallsyesno:27318}  OCCUPATION: ***  ACTIVITY LEVEL : ***  PLOF: {PLOF:24004}  PATIENT GOALS: ***  PERTINENT HISTORY:   See above Sexual abuse: {Yes/No:304960894}  BOWEL MOVEMENT: Pain with bowel movement: {yes/no:20286} Type of bowel movement:{PT BM type:27100} Fully empty rectum: {No/Yes:304960894} Leakage: {Yes/No:304960894} Pads: {Yes/No:304960894} Fiber supplement/laxative {YES/NO AS:20300}  URINATION: Pain with urination: {yes/no:20286} Fully empty bladder: {Yes/No:304960894}*** Stream: {PT urination:27102} Urgency: {YES/NO AS:20300} Frequency: *** Leakage: {PT leakage:27103} Pads: {Yes/No:304960894}  INTERCOURSE:  Ability to have vaginal penetration {YES/NO:21197} Pain with intercourse: {pain with intercourse PA:27099} Dryness{YES/NO AS:20300} Climax: *** Marinoff Scale: ***/3 Laxative:  PREGNANCY: Vaginal deliveries *** Tearing {Yes***/No:304960894} Episiotomy {YES/NO AS:20300} C-section deliveries *** Currently pregnant {Yes***/No:304960894}  PROLAPSE: {PT prolapse:27101}   OBJECTIVE:  Note: Objective measures were completed at Evaluation unless otherwise noted.  DIAGNOSTIC FINDINGS:  ***  PATIENT SURVEYS:  {rehab surveys:24030}  PFIQ-7: ***  COGNITION: Overall cognitive status: {cognition:24006}     SENSATION: Light touch: {intact/deficits:24005}  LUMBAR SPECIAL TESTS:  {lumbar special test:25242}  FUNCTIONAL TESTS:  {Functional tests:24029}  GAIT: Assistive device utilized: {Assistive devices:23999} Comments: ***  POSTURE: {posture:25561}   LUMBARAROM/PROM:  A/PROM A/PROM  eval  Flexion   Extension   Right lateral flexion   Left lateral flexion   Right rotation   Left rotation    (Blank rows = not tested)  LOWER EXTREMITY ROM:  {AROM/PROM:27142} ROM Right eval Left eval  Hip flexion    Hip extension    Hip abduction    Hip adduction    Hip internal rotation    Hip external rotation    Knee flexion    Knee extension    Ankle dorsiflexion    Ankle plantarflexion    Ankle inversion    Ankle eversion     (Blank rows = not  tested)  LOWER EXTREMITY MMT:  MMT Right eval Left eval  Hip flexion    Hip extension    Hip abduction    Hip adduction    Hip internal rotation    Hip external rotation    Knee flexion    Knee extension    Ankle dorsiflexion    Ankle plantarflexion    Ankle inversion    Ankle eversion     (Blank rows = not tested) PALPATION:   General: ***  Pelvic Alignment: ***  Abdominal: ***                External Perineal Exam: ***                             Internal Pelvic Floor: ***  Patient confirms identification and approves PT to assess internal pelvic floor and treatment {yes/no:20286}  PELVIC MMT:   MMT eval  Vaginal   Internal Anal Sphincter   External Anal Sphincter   Puborectalis   Diastasis Recti   (Blank rows = not tested)        TONE: ***  PROLAPSE: ***  TODAY'S TREATMENT:                                                                                                                              DATE: ***  EVAL ***   PATIENT EDUCATION:  Education details: *** Person educated: {Person educated:25204} Education method: {Education Method:25205} Education comprehension: {Education Comprehension:25206}  HOME EXERCISE PROGRAM: ***  ASSESSMENT:  CLINICAL IMPRESSION: Patient is a *** y.o. *** who was seen today for physical therapy evaluation and treatment for ***.   OBJECTIVE IMPAIRMENTS: {opptimpairments:25111}.   ACTIVITY LIMITATIONS: {activitylimitations:27494}  PARTICIPATION LIMITATIONS: {participationrestrictions:25113}  PERSONAL FACTORS: {Personal factors:25162} are also affecting patient's functional outcome.   REHAB POTENTIAL: {rehabpotential:25112}  CLINICAL DECISION MAKING: {clinical decision making:25114}  EVALUATION COMPLEXITY: {Evaluation complexity:25115}   GOALS: Goals reviewed with patient? {yes/no:20286}  SHORT TERM GOALS: Target date: ***  ***  Baseline: Goal status: INITIAL  2.  *** Baseline:  Goal status:  INITIAL  3.  *** Baseline:  Goal status: INITIAL  4.  *** Baseline:  Goal status: INITIAL  5.  *** Baseline:  Goal status: INITIAL  6.  *** Baseline:  Goal status: INITIAL  LONG TERM GOALS: Target date: ***  *** Baseline:  Goal status: INITIAL  2.  *** Baseline:  Goal status: INITIAL  3.  *** Baseline:  Goal status: INITIAL  4.  *** Baseline:  Goal status: INITIAL  5.  *** Baseline:  Goal status: INITIAL  6.  *** Baseline:  Goal status: INITIAL  PLAN:  PT FREQUENCY: {rehab frequency:25116}  PT DURATION: {rehab duration:25117}  PLANNED INTERVENTIONS: {rehab planned interventions:25118::97110-Therapeutic exercises,97530- Therapeutic [phone]- Neuromuscular re-education,97535- Self Rjmz,02859- Manual therapy}  PLAN FOR NEXT SESSION: ***   Tou Hayner, PT 12/16/2023, 10:16 AM

## 2023-12-17 ENCOUNTER — Encounter: Payer: Self-pay | Admitting: Physical Therapy

## 2023-12-17 ENCOUNTER — Encounter: Attending: Obstetrics and Gynecology | Admitting: Physical Therapy

## 2023-12-17 ENCOUNTER — Other Ambulatory Visit: Payer: Self-pay

## 2023-12-17 DIAGNOSIS — M5459 Other low back pain: Secondary | ICD-10-CM | POA: Diagnosis present

## 2023-12-17 DIAGNOSIS — R252 Cramp and spasm: Secondary | ICD-10-CM | POA: Diagnosis present

## 2023-12-17 DIAGNOSIS — R1084 Generalized abdominal pain: Secondary | ICD-10-CM | POA: Insufficient documentation

## 2023-12-17 NOTE — Therapy (Signed)
 OUTPATIENT PHYSICAL THERAPY FEMALE PELVIC EVALUATION   Patient Name: Marisa Fowler MRN: [id number] DOB:1995-08-22, 28 y.o., female Today's Date: 12/17/2023  END OF SESSION:  PT End of Session - 12/17/23 1410     Visit Number 1    Date for PT Re-Evaluation 06/15/24    Authorization Type [employer] STate    PT Start Time 1400    PT Stop Time 1445    PT Time Calculation (min) 45 min    Activity Tolerance Patient tolerated treatment well    Behavior During Therapy [facility] for tasks assessed/performed          Past Medical History:  Diagnosis Date   Headache    Heart murmur    Past Surgical History:  Procedure Laterality Date   no syrgical history     WISDOM TOOTH EXTRACTION     Patient Active Problem List   Diagnosis Date Noted   Seasonal and perennial allergic rhinitis 08/01/2021   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   Dysmenorrhea 05/16/2020   Menometrorrhagia 05/16/2020   Tension headache, chronic 02/01/2019   Acute bronchitis 06/28/2016   Postconcussion syndrome 06/20/2015   Headache 16-Jul-2015   Head injury due to trauma 07-16-2015   Memory changes 2015-07-16   Concentration deficit 07-16-2015   Unhealthy sleep habit 2015-07-16   Stress Jul 16, 2015   Death of family member Jul 16, 2015   DIZZINESS 03/23/2009    PCP: Lenon Nell SAILOR, FNP  REFERRING PROVIDER: Perri Bjork, PA-C   REFERRING DIAG: R10.2 (ICD-10-CM) - Pelvic and perineal pain   THERAPY DIAG:  Cramp and spasm - Plan: PT plan of care cert/re-cert  Generalized abdominal pain - Plan: PT plan of care cert/re-cert  Other low back pain - Plan: PT plan of care cert/re-cert  Rationale for Evaluation and Treatment: Rehabilitation  ONSET DATE: 2024  SUBJECTIVE:                                                                                                                                                                                           SUBJECTIVE STATEMENT: Patient has a lot of low back  and abdominal pain. MD said her pelvic floor was weak. Unable to wear tampons due to falling out Fluid intake: water PAIN:  Are you having pain? Yes: NPRS scale: 7/10 Pain location: lower abdominal pain Pain description: constant cramps pain Aggravating factors: randomly Relieving factors: randomly  PAIN:  Are you having pain? Yes NPRS scale: 7/10 Pain location: low back pain  Pain type: dull Pain description: intermittent   Aggravating factors: laying on her back in bed Relieving factors: lay on side  PRECAUTIONS: None  RED FLAGS: None  WEIGHT BEARING RESTRICTIONS: No  FALLS:  Has patient fallen in last 6 months? No  OCCUPATION: teacher  ACTIVITY LEVEL : low activity  PLOF: Independent  PATIENT GOALS: reduce pain and increase pelvic floor strength  PERTINENT HISTORY:  See above Sexual abuse: No  BOWEL MOVEMENT: Pain with bowel movement: No Type of bowel movement:Type (Bristol Stool Scale) Type 3 or 4, Frequency 1-2 times per week, and Strain yes Fully empty rectum: No Leakage: No, gas leaks out Fiber supplement/laxative No  URINATION: Pain with urination: No Fully empty bladder: Yes:   Leakage: none  INTERCOURSE:  Ability to have vaginal penetration Yes  Pain with intercourse: Initial Penetration, During Penetration, Deep Penetration, and After Intercourse Dryness; Yes , no lubricant Climax: yes Marinoff Scale: 1/3   PREGNANCY:no pregnancies  PROLAPSE: Pressure and Bulge   OBJECTIVE:  Note: Objective measures were completed at Evaluation unless otherwise noted.  DIAGNOSTIC FINDINGS:  none  PATIENT SURVEYS:  PFIQ-7: 14 POPIQ-7; 14  COGNITION: Overall cognitive status: Within functional limits for tasks assessed      LUMBAR SPECIAL TESTS:  SI Compression/distraction test: Positive on right  FUNCTIONAL TESTS:  Squat with increased lumbar extension    LUMBARAROM/PROM:  A/PROM A/PROM  eval  Extension Decreased by 25%  Right  lateral flexion Decreased by 25%  Left rotation Decreased by 25%   (Blank rows = not tested)  LOWER EXTREMITY ROM: full right hip flexion but tightness in the end range.    LOWER EXTREMITY MMT:  MMT Right eval Left eval  Hip flexion 4/5 5/5  Hip extension 5/5 4/5  Hip abduction 3+/5 5/5   (Blank rows = not tested) PALPATION:  Pelvic Alignment: Right ilium is anteriorly rotated  Abdominal: Patient will bulge her abdomen when lifting her head or legs; tenderness located in the right upper and lower quadrant; sacrum rotated left                External Perineal Exam: tenderness located on perineal body                             Internal Pelvic Floor: tenderness located on the right levator ani  Patient confirms identification and approves PT to assess internal pelvic floor and treatment Yes No emotional/communication barriers or cognitive limitation. Patient is motivated to learn. Patient understands and agrees with treatment goals and plan. PT explains patient will be examined in standing, sitting, and lying down to see how their muscles and joints work. When they are ready, they will be asked to remove their underwear so PT can examine their perineum. The patient is also given the option of providing their own chaperone as one is not provided in our facility. The patient also has the right and is explained the right to defer or refuse any part of the evaluation or treatment including the internal exam. With the patient's consent, PT will use one gloved finger to gently assess the muscles of the pelvic floor, seeing how well it contracts and relaxes and if there is muscle symmetry. After, the patient will get dressed and PT and patient will discuss exam findings and plan of care. PT and patient discuss plan of care, schedule, attendance policy and HEP activities.   PELVIC MMT:   MMT eval  Vaginal 2/5 on right and 3/5 other sides then after manual work 3/5 circular contraction  (Blank  rows = not tested)  PROLAPSE: Slight anterior wall weakness  TODAY'S TREATMENT:                                                                                                                              DATE: 12/17/23  EVAL Examination completed, findings reviewed, pt educated on POC, HEP, and female pelvic floor anatomy, reasoning with pelvic floor assessment internally with pt consent,. Pt motivated to participate in PT and agreeable to attempt recommendations.     PATIENT EDUCATION:  12/17/23 Education details: Access Code: [email or web address] Person educated: Patient Education method: Explanation, Demonstration, Tactile cues, Verbal cues, and Handouts Education comprehension: verbalized understanding, returned demonstration, verbal cues required, tactile cues required, and needs further education  HOME EXERCISE PROGRAM: 12/17/23 Access Code: [email or web address] URL: [email or web address] Date: 12/17/2023 Prepared by: Channing Pereyra  Exercises - Seated Abdominal Press into [employer]  - 1 x daily - 7 x weekly - 1 sets - 10 reps - Quadruped Full Range Thoracic Rotation with Reach  - 1 x daily - 7 x weekly - 1 sets - 10 reps  ASSESSMENT:  CLINICAL IMPRESSION: Patient is a 28 y.o. female who was seen today for physical therapy evaluation and treatment for perineal pain. Patient reports low back pain at level 7/10 when she lays on her back. Her abdominal pain is  7/10 that comes on randomly. She had decreased lumbar ROM. Right ilium was rotated anteriorly and sacrum rotated left. She had tightness in right hip flexion endrange. She has weakness in right hip. Her L5 is rotated. Pelvic floor strength was 3/5 except for right side that is 2/5. She had tenderness located on the right levator ani. She has pain with vaginal penetration. Patient had a slight anterior wall weakness. Patient will benefit from skilled therapy to improve strength and reduce her pain.   OBJECTIVE IMPAIRMENTS:  decreased activity tolerance, decreased endurance, decreased ROM, decreased strength, increased fascial restrictions, increased muscle spasms, and pain.   ACTIVITY LIMITATIONS: squatting, sleeping, and bed mobility  PARTICIPATION LIMITATIONS: interpersonal relationship and community activity  PERSONAL FACTORS: Time since onset of injury/illness/exacerbation are also affecting patient's functional outcome.   REHAB POTENTIAL: Excellent  CLINICAL DECISION MAKING: Stable/uncomplicated  EVALUATION COMPLEXITY: Low   GOALS: Goals reviewed with patient? Yes  SHORT TERM GOALS: Target date: 01/14/24  Patient independent with initial HEP for ROM and strength.  Baseline: Goal status: INITIAL   LONG TERM GOALS: Target date: 06/16/23  Patient independent with advanced HEP for core and pelvic floor strength.  Baseline:  Goal status: INITIAL  2.  Patient is able to have vaginal penetration without pain due to improved alignment of pelvis and reduction of trigger points.  Baseline:  Goal status: INITIAL  3.  Patient is able to lay on her back without pain due to increased mobility of lumbar and correct pelvic alignment.  Baseline:  Goal status: INITIAL  4.  Patient has improved quality of life due to reduction of pain.  Baseline:  Goal status: INITIAL   PLAN:  PT FREQUENCY: 1-2x/week  PT DURATION: other: 6 months  PLANNED INTERVENTIONS: 97110-Therapeutic exercises, 97530- Therapeutic activity, 97112- Neuromuscular re-education, 97535- Self Care, 02859- Manual therapy, [id number]- Electrical stimulation (unattended), 20560 (1-2 muscles), 20561 (3+ muscles)- Dry Needling, Patient/Family education, Joint mobilization, Spinal mobilization, Cryotherapy, Moist heat, and Biofeedback  PLAN FOR NEXT SESSION: correct pelvis, work on perineal body, core strength   Channing Pereyra, PT 12/17/23 3:48 PM

## 2023-12-23 ENCOUNTER — Encounter: Payer: Self-pay | Admitting: Physical Therapy

## 2023-12-23 ENCOUNTER — Ambulatory Visit: Payer: Self-pay | Attending: Obstetrics and Gynecology | Admitting: Physical Therapy

## 2023-12-23 DIAGNOSIS — R1084 Generalized abdominal pain: Secondary | ICD-10-CM | POA: Insufficient documentation

## 2023-12-23 DIAGNOSIS — M5459 Other low back pain: Secondary | ICD-10-CM | POA: Insufficient documentation

## 2023-12-23 DIAGNOSIS — R252 Cramp and spasm: Secondary | ICD-10-CM | POA: Diagnosis present

## 2023-12-23 NOTE — Therapy (Signed)
 OUTPATIENT PHYSICAL THERAPY FEMALE PELVIC TREATMENT   Patient Name: Marisa Fowler MRN: [id number] DOB:September 12, 1995, 28 y.o., female Today's Date: 12/23/2023  END OF SESSION:  PT End of Session - 12/23/23 1623     Visit Number 2    Date for PT Re-Evaluation 06/15/24    Authorization Type [employer] STate    PT Start Time 1615    PT Stop Time 1655    PT Time Calculation (min) 40 min    Activity Tolerance Patient tolerated treatment well    Behavior During Therapy [facility] for tasks assessed/performed          Past Medical History:  Diagnosis Date   Headache    Heart murmur    Past Surgical History:  Procedure Laterality Date   no syrgical history     WISDOM TOOTH EXTRACTION     Patient Active Problem List   Diagnosis Date Noted   Seasonal and perennial allergic rhinitis 08/01/2021   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   Dysmenorrhea 05/16/2020   Menometrorrhagia 05/16/2020   Tension headache, chronic 02/01/2019   Acute bronchitis 06/28/2016   Postconcussion syndrome 06/20/2015   Headache 07/14/2015   Head injury due to trauma 07-14-2015   Memory changes 07-14-2015   Concentration deficit 2015/07/14   Unhealthy sleep habit 07-14-2015   Stress 07-14-15   Death of family member 2015/07/14   DIZZINESS 03/23/2009    PCP: Lenon Nell SAILOR, FNP  REFERRING PROVIDER: Perri Bjork, PA-C   REFERRING DIAG: R10.2 (ICD-10-CM) - Pelvic and perineal pain   THERAPY DIAG:  Cramp and spasm  Generalized abdominal pain  Other low back pain  Rationale for Evaluation and Treatment: Rehabilitation  ONSET DATE: 2024  SUBJECTIVE:                                                                                                                                                                                           SUBJECTIVE STATEMENT: I have no pain today but yesterday it was an 8/10.    PAIN:  Are you having pain? Yes: NPRS scale: 7/10 Pain location: lower abdominal  pain Pain description: constant cramps pain Aggravating factors: randomly Relieving factors: randomly  PAIN:  Are you having pain? Yes NPRS scale: 7/10 Pain location: low back pain  Pain type: dull Pain description: intermittent   Aggravating factors: laying on her back in bed Relieving factors: lay on side  PRECAUTIONS: None  RED FLAGS: None   WEIGHT BEARING RESTRICTIONS: No  FALLS:  Has patient fallen in last 6 months? No  OCCUPATION: teacher  ACTIVITY LEVEL : low activity  PLOF: Independent  PATIENT GOALS: reduce pain and  increase pelvic floor strength  PERTINENT HISTORY:  See above Sexual abuse: No  BOWEL MOVEMENT: Pain with bowel movement: No Type of bowel movement:Type (Bristol Stool Scale) Type 3 or 4, Frequency 1-2 times per week, and Strain yes Fully empty rectum: No Leakage: No, gas leaks out Fiber supplement/laxative No  URINATION: Pain with urination: No Fully empty bladder: Yes:   Leakage: none  INTERCOURSE:  Ability to have vaginal penetration Yes  Pain with intercourse: Initial Penetration, During Penetration, Deep Penetration, and After Intercourse Dryness; Yes , no lubricant Climax: yes Marinoff Scale: 1/3   PREGNANCY:no pregnancies  PROLAPSE: Pressure and Bulge   OBJECTIVE:  Note: Objective measures were completed at Evaluation unless otherwise noted.  DIAGNOSTIC FINDINGS:  none  PATIENT SURVEYS:  PFIQ-7: 14 POPIQ-7; 14  COGNITION: Overall cognitive status: Within functional limits for tasks assessed      LUMBAR SPECIAL TESTS:  SI Compression/distraction test: Positive on right  FUNCTIONAL TESTS:  Squat with increased lumbar extension    LUMBARAROM/PROM:  A/PROM A/PROM  eval A/ROM  12/23/23  Extension Decreased by 25% full  Right lateral flexion Decreased by 25% full  Left rotation Decreased by 25% full   (Blank rows = not tested)  LOWER EXTREMITY ROM: full right hip flexion but tightness in the end  range.    LOWER EXTREMITY MMT:  MMT Right eval Left eval  Hip flexion 4/5 5/5  Hip extension 5/5 4/5  Hip abduction 3+/5 5/5   (Blank rows = not tested) PALPATION:  Pelvic Alignment: Right ilium is anteriorly rotated 12/23/23: ASIS are equal  Abdominal: Patient will bulge her abdomen when lifting her head or legs; tenderness located in the right upper and lower quadrant; sacrum rotated left                External Perineal Exam: tenderness located on perineal body                             Internal Pelvic Floor: tenderness located on the right levator ani  Patient confirms identification and approves PT to assess internal pelvic floor and treatment Yes No emotional/communication barriers or cognitive limitation. Patient is motivated to learn. Patient understands and agrees with treatment goals and plan. PT explains patient will be examined in standing, sitting, and lying down to see how their muscles and joints work. When they are ready, they will be asked to remove their underwear so PT can examine their perineum. The patient is also given the option of providing their own chaperone as one is not provided in our facility. The patient also has the right and is explained the right to defer or refuse any part of the evaluation or treatment including the internal exam. With the patient's consent, PT will use one gloved finger to gently assess the muscles of the pelvic floor, seeing how well it contracts and relaxes and if there is muscle symmetry. After, the patient will get dressed and PT and patient will discuss exam findings and plan of care. PT and patient discuss plan of care, schedule, attendance policy and HEP activities.   PELVIC MMT:   MMT eval  Vaginal 2/5 on right and 3/5 other sides then after manual work 3/5 circular contraction  (Blank rows = not tested)          PROLAPSE: Slight anterior wall weakness  TODAY'S TREATMENT:     12/23/23 Manual: Spinal  mobilization: PA and rotational mobilization to  L3-L5 grade 3 Neuromuscular re-education: Core retraining: Supine hip flexion isometric 10 x each side with lower abdominal contraction Supine alternate shoulder and hip flexion with core engaged 10 x each side Quadruped lift alternate arm and leg 10 each side with tactile cues to engage the core Down training: Supine diaphragmatic breathing with opening the lower rib cage and feeling the pelvic floor relax Exercises: Stretches/mobility: Karolynn pose holding 30 sec Happy baby holding 1 minute Supine piriformis stretch holding 30 sec each                                                                                                                             DATE: 12/17/23  EVAL Examination completed, findings reviewed, pt educated on POC, HEP, and female pelvic floor anatomy, reasoning with pelvic floor assessment internally with pt consent,. Pt motivated to participate in PT and agreeable to attempt recommendations.     PATIENT EDUCATION:  12/23/23 Education details: Access Code: [email or web address] Person educated: Patient Education method: Explanation, Demonstration, Tactile cues, Verbal cues, and Handouts Education comprehension: verbalized understanding, returned demonstration, verbal cues required, tactile cues required, and needs further education  HOME EXERCISE PROGRAM: 12/23/23 Access Code: [email or web address] URL: [email or web address] Date: 12/23/2023 Prepared by: Channing Pereyra  Exercises - Seated Abdominal Press into [employer]  - 1 x daily - 7 x weekly - 1 sets - 10 reps - Quadruped Full Range Thoracic Rotation with Reach  - 1 x daily - 7 x weekly - 1 sets - 10 reps - Supine Diaphragmatic Breathing  - 1 x daily - 7 x weekly - 1 sets - 10 reps - Seated Diaphragmatic Breathing  - 1 x daily - 7 x weekly - 1 sets - 10 reps - Hooklying Isometric Hip Flexion  - 1 x daily - 3 x weekly - 1 sets - 10 reps - Quadruped Pelvic Floor  Contraction with Opposite Arm and Leg Lift  - 1 x daily - 3 x weekly - 2 sets - 10 reps - Dead Bug  - 1 x daily - 3 x weekly - 2 sets - 10 reps - Sidelying Diagonal Hip Abduction  - 1 x daily - 3 x weekly - 2 sets - 10 reps - Happy Baby with Pelvic Floor Lengthening  - 1 x daily - 3 x weekly - 1 sets - 1 reps - 30 sec hold - Supine Piriformis Stretch  - 1 x daily - 3 x weekly - 1 sets - 2 reps - 30 sec hold  ASSESSMENT:  CLINICAL IMPRESSION: Patient is a 28 y.o. female who was seen today for physical therapy evaluation and treatment for perineal pain.  Patient had full lumbar ROM after manual work to the lumbar. The pelvis in correct alignment. Patient had no pain during therapy. She is learning how to engage her abdominals. Patient was able to feel her pelvic floor relax with diaphragmatic breathing. Patient will benefit from skilled therapy to improve strength and  reduce her pain.   OBJECTIVE IMPAIRMENTS: decreased activity tolerance, decreased endurance, decreased ROM, decreased strength, increased fascial restrictions, increased muscle spasms, and pain.   ACTIVITY LIMITATIONS: squatting, sleeping, and bed mobility  PARTICIPATION LIMITATIONS: interpersonal relationship and community activity  PERSONAL FACTORS: Time since onset of injury/illness/exacerbation are also affecting patient's functional outcome.   REHAB POTENTIAL: Excellent  CLINICAL DECISION MAKING: Stable/uncomplicated  EVALUATION COMPLEXITY: Low   GOALS: Goals reviewed with patient? Yes  SHORT TERM GOALS: Target date: 01/14/24  Patient independent with initial HEP for ROM and strength.  Baseline: Goal status: INITIAL   LONG TERM GOALS: Target date: 06/16/23  Patient independent with advanced HEP for core and pelvic floor strength.  Baseline:  Goal status: INITIAL  2.  Patient is able to have vaginal penetration without pain due to improved alignment of pelvis and reduction of trigger points.  Baseline:  Goal  status: INITIAL  3.  Patient is able to lay on her back without pain due to increased mobility of lumbar and correct pelvic alignment.  Baseline:  Goal status: INITIAL  4.  Patient has improved quality of life due to reduction of pain.  Baseline:  Goal status: INITIAL   PLAN:  PT FREQUENCY: 1-2x/week  PT DURATION: other: 6 months  PLANNED INTERVENTIONS: 97110-Therapeutic exercises, 97530- Therapeutic activity, [id number]- Neuromuscular re-education, 97535- Self Care, 02859- Manual therapy, [id number]- Electrical stimulation (unattended), 20560 (1-2 muscles), 20561 (3+ muscles)- Dry Needling, Patient/Family education, Joint mobilization, Spinal mobilization, Cryotherapy, Moist heat, and Biofeedback  PLAN FOR NEXT SESSION:  core strength, check pelvic alignment   Channing Pereyra, PT 12/23/23 4:59 PM

## 2024-01-04 ENCOUNTER — Ambulatory Visit: Payer: Self-pay | Admitting: Physical Therapy

## 2024-01-25 ENCOUNTER — Emergency Department (HOSPITAL_BASED_OUTPATIENT_CLINIC_OR_DEPARTMENT_OTHER)
Admission: EM | Admit: 2024-01-25 | Discharge: 2024-01-25 | Disposition: A | Discharge status: Home / Self Care | Attending: Emergency Medicine | Admitting: Emergency Medicine

## 2024-01-25 ENCOUNTER — Other Ambulatory Visit (HOSPITAL_BASED_OUTPATIENT_CLINIC_OR_DEPARTMENT_OTHER): Payer: Self-pay

## 2024-01-25 ENCOUNTER — Other Ambulatory Visit: Payer: Self-pay

## 2024-01-25 ENCOUNTER — Emergency Department (HOSPITAL_BASED_OUTPATIENT_CLINIC_OR_DEPARTMENT_OTHER)

## 2024-01-25 ENCOUNTER — Encounter (HOSPITAL_BASED_OUTPATIENT_CLINIC_OR_DEPARTMENT_OTHER): Payer: Self-pay

## 2024-01-25 DIAGNOSIS — D259 Leiomyoma of uterus, unspecified: Secondary | ICD-10-CM | POA: Diagnosis not present

## 2024-01-25 DIAGNOSIS — M4326 Fusion of spine, lumbar region: Secondary | ICD-10-CM | POA: Diagnosis not present

## 2024-01-25 DIAGNOSIS — N83201 Unspecified ovarian cyst, right side: Secondary | ICD-10-CM | POA: Diagnosis not present

## 2024-01-25 DIAGNOSIS — S32009K Unspecified fracture of unspecified lumbar vertebra, subsequent encounter for fracture with nonunion: Secondary | ICD-10-CM

## 2024-01-25 DIAGNOSIS — R1031 Right lower quadrant pain: Secondary | ICD-10-CM | POA: Diagnosis present

## 2024-01-25 LAB — URINALYSIS, ROUTINE W REFLEX MICROSCOPIC
Bacteria, UA: NONE SEEN
Bilirubin Urine: NEGATIVE
Glucose, UA: NEGATIVE mg/dL
Ketones, ur: NEGATIVE mg/dL
Nitrite: NEGATIVE
Protein, ur: NEGATIVE mg/dL
Specific Gravity, Urine: <1.005 — ABNORMAL LOW (ref 1.005–1.030)
pH: 7 (ref 5.0–8.0)

## 2024-01-25 LAB — CBC WITH DIFFERENTIAL/PLATELET
Abs Immature Granulocytes: 0.02 K/uL (ref 0.00–0.07)
Basophils Absolute: 0.1 K/uL (ref 0.0–0.1)
Basophils Relative: 1 %
Eosinophils Absolute: 0.2 K/uL (ref 0.0–0.5)
Eosinophils Relative: 3 %
HCT: 34.6 % — ABNORMAL LOW (ref 36.0–46.0)
Hemoglobin: 11.3 g/dL — ABNORMAL LOW (ref 12.0–15.0)
Immature Granulocytes: 0 %
Lymphocytes Relative: 33 %
Lymphs Abs: 2.5 K/uL (ref 0.7–4.0)
MCH: 22.7 pg — ABNORMAL LOW (ref 26.0–34.0)
MCHC: 32.7 g/dL (ref 30.0–36.0)
MCV: 69.6 fL — ABNORMAL LOW (ref 80.0–100.0)
Monocytes Absolute: 0.5 K/uL (ref 0.1–1.0)
Monocytes Relative: 7 %
Neutro Abs: 4.1 K/uL (ref 1.7–7.7)
Neutrophils Relative %: 56 %
Platelets: 335 K/uL (ref 150–400)
RBC: 4.97 MIL/uL (ref 3.87–5.11)
RDW: 16.3 % — ABNORMAL HIGH (ref 11.5–15.5)
WBC: 7.4 K/uL (ref 4.0–10.5)
nRBC: 0 % (ref 0.0–0.2)

## 2024-01-25 LAB — COMPREHENSIVE METABOLIC PANEL WITH GFR
ALT: <5 U/L (ref 0–44)
AST: 15 U/L (ref 15–41)
Albumin: 4.4 g/dL (ref 3.5–5.0)
Alkaline Phosphatase: 71 U/L (ref 38–126)
Anion gap: 9 (ref 5–15)
BUN: 8 mg/dL (ref 6–20)
CO2: 25 mmol/L (ref 22–32)
Calcium: 9.5 mg/dL (ref 8.9–10.3)
Chloride: 104 mmol/L (ref 98–111)
Creatinine, Ser: 0.68 mg/dL (ref 0.44–1.00)
GFR, Estimated: >60 mL/min (ref >60)
Glucose, Bld: 90 mg/dL (ref 70–99)
Potassium: 3.7 mmol/L (ref 3.5–5.1)
Sodium: 138 mmol/L (ref 135–145)
Total Bilirubin: 0.3 mg/dL (ref 0.0–1.2)
Total Protein: 7.2 g/dL (ref 6.5–8.1)

## 2024-01-25 LAB — PREGNANCY, URINE: Preg Test, Ur: NEGATIVE

## 2024-01-25 LAB — LIPASE, BLOOD: Lipase: 23 U/L (ref 11–51)

## 2024-01-25 MED ORDER — SODIUM CHLORIDE 0.9 % IV BOLUS
1000.0000 mL | Freq: Once | INTRAVENOUS | Status: AC
  Administered 2024-01-25: 1000 mL via INTRAVENOUS

## 2024-01-25 MED ORDER — FENTANYL CITRATE (PF) 50 MCG/ML IJ SOSY
50.0000 ug | PREFILLED_SYRINGE | Freq: Once | INTRAMUSCULAR | Status: AC
  Administered 2024-01-25: 50 ug via INTRAVENOUS
  Filled 2024-01-25: qty 1

## 2024-01-25 MED ORDER — IOHEXOL 300 MG/ML  SOLN
100.0000 mL | Freq: Once | INTRAMUSCULAR | Status: AC | PRN
  Administered 2024-01-25: 75 mL via INTRAVENOUS

## 2024-01-25 MED ORDER — ONDANSETRON HCL 4 MG/2ML IJ SOLN
4.0000 mg | Freq: Three times a day (TID) | INTRAMUSCULAR | Status: AC | PRN
  Administered 2024-01-25: 4 mg via INTRAVENOUS
  Filled 2024-01-25: qty 2

## 2024-01-25 MED ORDER — KETOROLAC TROMETHAMINE 15 MG/ML IJ SOLN
15.0000 mg | Freq: Once | INTRAMUSCULAR | Status: AC
  Administered 2024-01-25: 15 mg via INTRAVENOUS
  Filled 2024-01-25: qty 1

## 2024-01-25 MED ORDER — KETOROLAC TROMETHAMINE 10 MG PO TABS
10.0000 mg | ORAL_TABLET | Freq: Four times a day (QID) | ORAL | 0 refills | Status: AC | PRN
  Filled 2024-01-25: qty 20, 5d supply, fill #0

## 2024-01-25 NOTE — ED Provider Notes (Signed)
 [facility] AT [facility] Provider Note   CSN: [id number] Arrival date & time: 01/25/24  9343     Patient presents with: Back Pain and Abdominal Pain   Marisa Fowler Lezette Kitts is a 28 y.o. female.    Back Pain Associated symptoms: abdominal pain   Abdominal Pain    28 year old female with medical history significant for dysmenorrhea presenting to the emergency department with a chief complaint of right lower quadrant abdominal pain.  The patient also states that she has had bilateral lower back pain for several months, worse with movement and twisting.  She denies any radicular symptoms.  She denies any urinary or fecal incontinence, no saddle anesthesia.  She denies any numbness or weakness in her lower extremities.  She denies any IV drug use.  She denies any falls or trauma precipitating her back pain.  She denies any significant heavy lifting.  The back pain has bothered her, described as bilateral cramping discomfort in her low back.  She has had irregular menstrual cycles and her last menstrual period was 3 weeks ago.  She is currently on estrogen-containing OCPs.  This morning prompting her presentation to the ER she developed right lower quadrant abdominal pain, no significant radiation, no nausea or vomiting.  She was constipated for several days leading up to this weekend and took a stool softener which then prompted diarrhea starting yesterday.  She denies any blood in her stool.  She continues to endorse diarrhea and abdominal pain and discomfort in the right lower quadrant.  She endorses urinary frequency, denies any dysuria.  No fevers or chills.  Prior to Admission medications   Medication Sig Start Date End Date Taking? Authorizing Provider  ketorolac  (TORADOL ) 10 MG tablet Take 1 tablet (10 mg total) by mouth every 6 (six) hours as needed. 01/25/24  Yes Jerrol Agent, MD  albuterol  (VENTOLIN  HFA) 108 [phone] Base) MCG/ACT inhaler Inhale 2  puffs into the lungs every 6 (six) hours as needed for wheezing or shortness of breath. 08/01/21   Marinda Rocky SAILOR, MD  ALPRAZolam  (XANAX ) 0.25 MG tablet Take 1-2 tabs (0.25mg -0.50mg ) 30-60 minutes before procedure. May repeat if needed.Do not drive. 09/23/23   Ines Onetha NOVAK, MD  JUNEL FE 1/20 1-20 MG-MCG tablet Take 1 tablet by mouth daily. 03/18/20   [provider]    Allergies: Kiwi extract and Pineapple    Review of Systems  Gastrointestinal:  Positive for abdominal pain.  Musculoskeletal:  Positive for back pain.  All other systems reviewed and are negative.   Updated Vital Signs BP (!) 122/92 (BP Location: Right Arm)   Pulse 92   Temp 98.6 F (37 C) (Oral)   Resp 18   Ht 5' 3 (1.6 m)   Wt 53.5 kg   SpO2 100%   BMI 20.90 kg/m   Physical Exam Vitals and nursing note reviewed.  Constitutional:      General: She is not in acute distress.    Appearance: She is well-developed.  HENT:     Head: Normocephalic and atraumatic.  Eyes:     Conjunctiva/sclera: Conjunctivae normal.  Cardiovascular:     Rate and Rhythm: Normal rate and regular rhythm.     Heart sounds: No murmur heard. Pulmonary:     Effort: Pulmonary effort is normal. No respiratory distress.     Breath sounds: Normal breath sounds.  Abdominal:     Palpations: Abdomen is soft.     Tenderness: There is abdominal tenderness in  the right lower quadrant. There is right CVA tenderness and left CVA tenderness. There is no guarding. Positive signs include McBurney's sign.  Musculoskeletal:        General: No swelling.     Cervical back: Neck supple.     Comments: No midline tenderness to palpation of the cervical thoracic or lumbar spine.  Bilateral paraspinal muscular tenderness to palpation.  Negative straight leg raise test bilaterally.  Skin:    General: Skin is warm and dry.     Capillary Refill: Capillary refill takes less than 2 seconds.  Neurological:     Mental Status: She is alert.      Comments: 5 out of 5 strength in all 4 extremities with intact sensation to light touch  Psychiatric:        Mood and Affect: Mood normal.     (all labs ordered are listed, but only abnormal results are displayed) Labs Reviewed  CBC WITH DIFFERENTIAL/PLATELET - Abnormal; Notable for the following components:      Result Value   Hemoglobin 11.3 (*)    HCT 34.6 (*)    MCV 69.6 (*)    MCH 22.7 (*)    RDW 16.3 (*)    All other components within normal limits  URINALYSIS, ROUTINE W REFLEX MICROSCOPIC - Abnormal; Notable for the following components:   Color, Urine COLORLESS (*)    Specific Gravity, Urine <1.005 (*)    Hgb urine dipstick MODERATE (*)    Leukocytes,Ua TRACE (*)    All other components within normal limits  COMPREHENSIVE METABOLIC PANEL WITH GFR  LIPASE, BLOOD  PREGNANCY, URINE    EKG: None  Radiology: US  PELVIC COMPLETE W TRANSVAGINAL AND TORSION R/O Result Date: 01/25/2024 CLINICAL DATA:  Low back pain for a few months. Right lower quadrant pain. Indeterminate right adnexal lesion on CT. EXAM: TRANSABDOMINAL AND TRANSVAGINAL ULTRASOUND OF PELVIS DOPPLER ULTRASOUND OF OVARIES TECHNIQUE: Both transabdominal and transvaginal ultrasound examinations of the pelvis were performed. Transabdominal technique was performed for global imaging of the pelvis including uterus, ovaries, adnexal regions, and pelvic cul-de-sac. It was necessary to proceed with endovaginal exam following the transabdominal exam to visualize the endometrium and ovaries to better advantage. Color and duplex Doppler ultrasound was utilized to evaluate blood flow to the ovaries. COMPARISON:  Abdominopelvic CT 01/25/2024 and 12/24/2016. Pelvic ultrasound 03/22/2014. FINDINGS: Uterus Measurements: 7.5 x 3.5 x 4.2 cm = volume: 56.3 mL. Small posterior fundal fibroid measuring 1.6 cm maximally. Endometrium Thickness: 8 mm.  No focal abnormality visualized. Right ovary Measurements: 5.4 x 2.6 x 4.5 cm = volume: 32.5  mL. There is a hypoechoic component posteriorly which measures 5.1 x 2.5 x 2.4 cm. The right ovary demonstrates blood flow with color Doppler. Left ovary Measurements: 2.6 x 1.4 x 1.3 cm = volume: 2.7 mL. Normal appearance/no adnexal mass. Normal blood flow with color Doppler. Pulsed Doppler evaluation of both ovaries demonstrates normal low-resistance arterial and venous waveforms. Other findings Small amount of free pelvic fluid. IMPRESSION: 1. Indeterminate heterogeneous enlargement of the right ovary which may represent a hemorrhagic cyst or endometrioma. Recommend follow-up US  in 3-6 months. Note: This recommendation does not apply to premenarchal patients or to those with increased risk (genetic, family history, elevated tumor markers or other high-risk factors) of ovarian cancer. Reference: Radiology 2019 Nov; 293(2):359-371. 2. No evidence of ovarian torsion. 3. Small posterior fundal fibroid. 4. Small amount of free pelvic fluid. Electronically Signed   By: Elsie Perone M.D.   On: 01/25/2024  13:21   CT L-SPINE NO CHARGE Result Date: 01/25/2024 EXAM: CT OF THE LUMBAR SPINE WITHOUT CONTRAST 01/25/2024 09:02:47 AM TECHNIQUE: CT of the lumbar spine was performed without the administration of intravenous contrast. Multiplanar reformatted images are provided for review. Automated exposure control, iterative reconstruction, and/or weight based adjustment of the mA/kV was utilized to reduce the radiation dose to as low as reasonably achievable. COMPARISON: None available. CLINICAL HISTORY: Arrives ambulatory to the ED with complaints of low back pain for a few months. She now reports abdominal pain as well. FINDINGS: BONES AND ALIGNMENT: Normal vertebral body heights. No acute fracture or suspicious bone lesion. Normal alignment. There is pseudarthrosis present at S1-S2 bilaterally. DEGENERATIVE CHANGES: No significant degenerative changes. SOFT TISSUES: No acute abnormality. IMPRESSION: 1. Pseudarthrosis at  S1-2 bilaterally. Electronically signed by: Evalene Coho MD 01/25/2024 10:41 AM EDT RP Workstation: [id number]   CT ABDOMEN PELVIS W CONTRAST Result Date: 01/25/2024 EXAM: CT ABDOMEN AND PELVIS WITH CONTRAST 01/25/2024 09:02:47 AM TECHNIQUE: CT of the abdomen and pelvis was performed with the administration of 75 mL of iohexol (OMNIPAQUE) 300 MG/ML solution. Multiplanar reformatted images are provided for review. Automated exposure control, iterative reconstruction, and/or weight-based adjustment of the mA/kV was utilized to reduce the radiation dose to as low as reasonably achievable. COMPARISON: None available. CLINICAL HISTORY: RLQ abdominal pain; RLQ abd pain, acute, diarrhea. Arrives ambulatory to the ED with complaints of low back pain for a few months. She now reports abdominal pain as well. FINDINGS: LOWER CHEST: No acute abnormality. LIVER: The liver is unremarkable. GALLBLADDER AND BILE DUCTS: Gallbladder is unremarkable. No biliary ductal dilatation. SPLEEN: No acute abnormality. PANCREAS: No acute abnormality. ADRENAL GLANDS: No acute abnormality. KIDNEYS, URETERS AND BLADDER: No stones in the kidneys or ureters. No hydronephrosis. No perinephric or periureteral stranding. Urinary bladder is unremarkable. GI AND BOWEL: Stomach demonstrates no acute abnormality. There is no bowel obstruction. The appendix is not definitively identified but there are no findings suspicious for appendicitis. PERITONEUM AND RETROPERITONEUM: No ascites. No free air. VASCULATURE: Aorta is normal in caliber. LYMPH NODES: No lymphadenopathy. REPRODUCTIVE ORGANS: There is a right adnexal cyst present measuring approximately 4.9 x 3.4 x 4.5 cm. BONES AND SOFT TISSUES: No acute osseous abnormality. No focal soft tissue abnormality. IMPRESSION: 1. No acute findings. 2. Right adnexal cyst measuring approximately 4.9 x 3.4 x 4.5 cm. Electronically signed by: Evalene Coho MD 01/25/2024 10:19 AM EDT RP Workstation:  [id number]     Procedures   Medications Ordered in the ED  fentaNYL (SUBLIMAZE) injection 50 mcg (50 mcg Intravenous Given 01/25/24 0915)  sodium chloride 0.9 % bolus 1,000 mL (0 mLs Intravenous Stopped 01/25/24 1016)  ondansetron (ZOFRAN) injection 4 mg (4 mg Intravenous Given 01/25/24 0913)  iohexol (OMNIPAQUE) 300 MG/ML solution 100 mL (75 mLs Intravenous Contrast Given 01/25/24 0854)  ketorolac  (TORADOL ) 15 MG/ML injection 15 mg (15 mg Intravenous Given 01/25/24 1331)                                    Medical Decision Making Amount and/or Complexity of Data Reviewed Labs: ordered. Radiology: ordered.  Risk Prescription drug management.    28 year old female with medical history significant for dysmenorrhea presenting to the emergency department with a chief complaint of right lower quadrant abdominal pain.  The patient also states that she has had bilateral lower back pain for several months, worse with movement and twisting.  She  denies any radicular symptoms.  She denies any urinary or fecal incontinence, no saddle anesthesia.  She denies any numbness or weakness in her lower extremities.  She denies any IV drug use.  She denies any falls or trauma precipitating her back pain.  She denies any significant heavy lifting.  The back pain has bothered her, described as bilateral cramping discomfort in her low back.  She has had irregular menstrual cycles and her last menstrual period was 3 weeks ago.  She is currently on estrogen-containing OCPs.  This morning prompting her presentation to the ER she developed right lower quadrant abdominal pain, no significant radiation, no nausea or vomiting.  She was constipated for several days leading up to this weekend and took a stool softener which then prompted diarrhea starting yesterday.  She denies any blood in her stool.  She continues to endorse diarrhea and abdominal pain and discomfort in the right lower quadrant.  She endorses urinary  frequency, denies any dysuria.  No fevers or chills.  On arrival, the patient was afebrile, not tachycardic or tachypneic, BP 158/88, saturating 99% on room air.  On exam the patient had right lower quadrant tenderness to palpation.  She had bilateral paraspinal muscular tenderness/CVA tenderness.  She had no midline tenderness of the cervical thoracic or lumbar spine.  She had a negative straight leg raise test and had intact strength and sensation of all 4 extremities.  IV access was obtained and the patient was administered IV fluid bolus and IV fentanyl for pain control.  Initial Assessment:   With the patient's presentation of abdominal pain, most likely diagnosis is ovarian cyst vs appendicitis. Other diagnoses were considered including (but not limited to) gastroenteritis, colitis, small bowel obstruction, cholecystitis, pancreatitis, nephrolithiasis, UTI, pyelonephritis, diverticulitis, ruptured ectopic pregnancy, PID, ovarian torsion. These are considered less likely due to history of present illness and physical exam findings.  Additionally regarding the patient's back pain that is chronic, considered uterine fibroids, spinal stenosis with radiculopathy.  Considered endometriosis.  Labs: CBC without leukocytosis, mild microcytic anemia to 11.3 noted, CMP unremarkable, lipase normal, urine pregnancy negative, urinalysis with trace hemoglobin and leukocytes, negative for UTI.  CT Abd Pelvis:  IMPRESSION:  1. No acute findings.  2. Right adnexal cyst measuring approximately 4.9 x 3.4 x 4.5 cm.   CT L Spine:  IMPRESSION:  1. Pseudarthrosis at S1-2 bilaterally.    US  Pelvis Torsion:   IMPRESSION:  1. Indeterminate heterogeneous enlargement of the right ovary which  may represent a hemorrhagic cyst or endometrioma. Recommend  follow-up US  in 3-6 months. Note: This recommendation does not apply  to premenarchal patients or to those with increased risk (genetic,  family history,  elevated tumor markers or other high-risk factors)  of ovarian cancer. Reference: Radiology 2019 Nov; 293(2):359-371.  2. No evidence of ovarian torsion.  3. Small posterior fundal fibroid.  4. Small amount of free pelvic fluid.    Patient informed the results of diagnostic testing, found to have uterine fibroid in addition to uterine cyst that is either hemorrhagic or an endometrioma.  Advised NSAIDs for pain control, feeling symptomatically proved on repeat assessment, administered Toradol  for pain control, prescribed Toradol  orally.  She has an OB/GYN she can follow-up with.  Overall at this time patient is well-appearing, stable for discharge, informed of the results of diagnostic testing, all questions answered at time of discharge.     Final diagnoses:  Lumbar pseudoarthrosis  Cyst of right ovary  Uterine leiomyoma, unspecified location  ED Discharge Orders          Ordered    Ambulatory referral to Obstetrics / Gynecology        01/25/24 1326    ketorolac  (TORADOL ) 10 MG tablet  Every 6 hours PRN        01/25/24 1327               Jerrol Agent, MD 01/25/24 1446

## 2024-01-25 NOTE — ED Triage Notes (Signed)
 Arrives ambulatory to the ED with complaints of low back pain for a few months. She now reports abdominal pain as well.

## 2024-01-25 NOTE — Discharge Instructions (Addendum)
 Recommend you follow-up with a gynecologist regarding your uterine fibroid and ovarian cyst on the right which is likely the cause of your pain. Your US  revealed: IMPRESSION:  1. Indeterminate heterogeneous enlargement of the right ovary which  may represent a hemorrhagic cyst or endometrioma. Recommend  follow-up US  in 3-6 months. Note: This recommendation does not apply  to premenarchal patients or to those with increased risk (genetic,  family history, elevated tumor markers or other high-risk factors)  of ovarian cancer. Reference: Radiology 2019 Nov; 293(2):359-371.  2. No evidence of ovarian torsion.  3. Small posterior fundal fibroid.  4. Small amount of free pelvic fluid.

## 2024-02-01 ENCOUNTER — Ambulatory Visit: Payer: Self-pay | Admitting: Physical Therapy

## 2024-02-03 ENCOUNTER — Encounter: Payer: Self-pay | Admitting: Physical Therapy

## 2024-02-03 ENCOUNTER — Ambulatory Visit: Payer: Self-pay | Attending: Obstetrics and Gynecology | Admitting: Physical Therapy

## 2024-02-03 DIAGNOSIS — R1084 Generalized abdominal pain: Secondary | ICD-10-CM | POA: Diagnosis present

## 2024-02-03 DIAGNOSIS — R252 Cramp and spasm: Secondary | ICD-10-CM | POA: Diagnosis present

## 2024-02-03 DIAGNOSIS — M5459 Other low back pain: Secondary | ICD-10-CM | POA: Insufficient documentation

## 2024-02-03 NOTE — Therapy (Signed)
 OUTPATIENT PHYSICAL THERAPY FEMALE PELVIC TREATMENT   Patient Name: Marisa Fowler MRN: [id number] DOB:[birth year], 28 y.o., female Today's Date: 02/03/2024  END OF SESSION:  PT End of Session - 02/03/24 1615     Visit Number 3    Date for Recertification  06/15/24    Authorization Type [employer]    PT Start Time 1615    PT Stop Time 1655    PT Time Calculation (min) 40 min    Activity Tolerance Patient tolerated treatment well    Behavior During Therapy [facility] for tasks assessed/performed          Past Medical History:  Diagnosis Date   Headache    Heart murmur    Past Surgical History:  Procedure Laterality Date   no syrgical history     WISDOM TOOTH EXTRACTION     Patient Active Problem List   Diagnosis Date Noted   Seasonal and perennial allergic rhinitis 08/01/2021   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   Dysmenorrhea 05/16/2020   Menometrorrhagia 05/16/2020   Tension headache, chronic 02/01/2019   Acute bronchitis 06/28/2016   Postconcussion syndrome 06/20/2015   Headache 2015-07-08   Head injury due to trauma Jul 08, 2015   Memory changes 07-08-15   Concentration deficit Jul 08, 2015   Unhealthy sleep habit 08-Jul-2015   Stress 08-Jul-2015   Death of family member 08-Jul-2015   DIZZINESS 03/23/2009    PCP: Lenon Nell SAILOR, FNP  REFERRING PROVIDER: Perri Bjork, PA-C   REFERRING DIAG: R10.2 (ICD-10-CM) - Pelvic and perineal pain   THERAPY DIAG:  Cramp and spasm  Generalized abdominal pain  Other low back pain  Rationale for Evaluation and Treatment: Rehabilitation  ONSET DATE: 2024  SUBJECTIVE:                                                                                                                                                                                           SUBJECTIVE STATEMENT: I went to the ER last week due to her back hurting so much. I have a cyst on my ovary. My OB says the pain in the abdomen is coming from the  cyst. I have not been doing my exercises. She is to see the doctor in 6 weeks to see if the cyst has shrunk. I am on my cycle this week so no internal.   PAIN:  Are you having pain? Yes: NPRS scale: 7/10 Pain location: lower abdominal pain Pain description: constant cramps pain Aggravating factors: randomly Relieving factors: randomly  PAIN:  Are you having pain? Yes NPRS scale: 7/10 Pain location: low back pain  Pain type: dull Pain description: intermittent  Aggravating factors: laying on her back in bed Relieving factors: lay on side  PRECAUTIONS: None  RED FLAGS: None   WEIGHT BEARING RESTRICTIONS: No  FALLS:  Has patient fallen in last 6 months? No  OCCUPATION: teacher  ACTIVITY LEVEL : low activity  PLOF: Independent  PATIENT GOALS: reduce pain and increase pelvic floor strength  PERTINENT HISTORY:  See above Sexual abuse: No  BOWEL MOVEMENT: Pain with bowel movement: No Type of bowel movement:Type (Bristol Stool Scale) Type 3 or 4, Frequency 1-2 times per week, and Strain yes Fully empty rectum: No Leakage: No, gas leaks out Fiber supplement/laxative No  URINATION: Pain with urination: No Fully empty bladder: Yes:   Leakage: none  INTERCOURSE:  Ability to have vaginal penetration Yes  Pain with intercourse: Initial Penetration, During Penetration, Deep Penetration, and After Intercourse Dryness; Yes , no lubricant Climax: yes Marinoff Scale: 1/3   PREGNANCY:no pregnancies  PROLAPSE: Pressure and Bulge   OBJECTIVE:  Note: Objective measures were completed at Evaluation unless otherwise noted.  DIAGNOSTIC FINDINGS:  none  PATIENT SURVEYS:  PFIQ-7: 14 POPIQ-7; 14  COGNITION: Overall cognitive status: Within functional limits for tasks assessed      LUMBAR SPECIAL TESTS:  SI Compression/distraction test: Positive on right  FUNCTIONAL TESTS:  Squat with increased lumbar extension    LUMBARAROM/PROM:  A/PROM A/PROM  eval  A/ROM  12/23/23  Extension Decreased by 25% full  Right lateral flexion Decreased by 25% full  Left rotation Decreased by 25% full   (Blank rows = not tested)  LOWER EXTREMITY ROM: full right hip flexion but tightness in the end range.    LOWER EXTREMITY MMT:  MMT Right eval Left eval  Hip flexion 4/5 5/5  Hip extension 5/5 4/5  Hip abduction 3+/5 5/5   (Blank rows = not tested) PALPATION:  Pelvic Alignment: Right ilium is anteriorly rotated 12/23/23: ASIS are equal  Abdominal: Patient will bulge her abdomen when lifting her head or legs; tenderness located in the right upper and lower quadrant; sacrum rotated left                External Perineal Exam: tenderness located on perineal body                             Internal Pelvic Floor: tenderness located on the right levator ani  Patient confirms identification and approves PT to assess internal pelvic floor and treatment Yes No emotional/communication barriers or cognitive limitation. Patient is motivated to learn. Patient understands and agrees with treatment goals and plan. PT explains patient will be examined in standing, sitting, and lying down to see how their muscles and joints work. When they are ready, they will be asked to remove their underwear so PT can examine their perineum. The patient is also given the option of providing their own chaperone as one is not provided in our facility. The patient also has the right and is explained the right to defer or refuse any part of the evaluation or treatment including the internal exam. With the patient's consent, PT will use one gloved finger to gently assess the muscles of the pelvic floor, seeing how well it contracts and relaxes and if there is muscle symmetry. After, the patient will get dressed and PT and patient will discuss exam findings and plan of care. PT and patient discuss plan of care, schedule, attendance policy and HEP activities.   PELVIC MMT:  MMT eval   Vaginal 2/5 on right and 3/5 other sides then after manual work 3/5 circular contraction  (Blank rows = not tested)          PROLAPSE: Slight anterior wall weakness  TODAY'S TREATMENT:    02/03/24 Manual: Spinal mobilization: PA mobilization of L1-L5 in prone grade 3 Neuromuscular re-education: Form correction: Supine with yoga block between knees and bring feet apart working on core and hip mobility 2 x 10  Quadruped with knee on yoga block and bring hips to the side of the yoga block to stretch the SI joint then cat camel Standing with feet apart and move 5# side to side across the body 10 x  Stand with foot ahead and reach with 5# wt in hand across the body 10 x each Quadruped lift alternate arm and leg 10 each side with tactile cues to engage the core Down training: Sit on foam roll and massage the pelvic floor.  Supine happy baby with one leg holding 30 sec each     12/23/23 Manual: Spinal mobilization: PA and rotational mobilization to L3-L5 grade 3 Neuromuscular re-education: Core retraining: Supine hip flexion isometric 10 x each side with lower abdominal contraction Supine alternate shoulder and hip flexion with core engaged 10 x each side Quadruped lift alternate arm and leg 10 each side with tactile cues to engage the core Down training: Supine diaphragmatic breathing with opening the lower rib cage and feeling the pelvic floor relax Exercises: Stretches/mobility: Karolynn pose holding 30 sec Happy baby holding 1 minute Supine piriformis stretch holding 30 sec each                                                                                                                             DATE: 12/17/23  EVAL Examination completed, findings reviewed, pt educated on POC, HEP, and female pelvic floor anatomy, reasoning with pelvic floor assessment internally with pt consent,. Pt motivated to participate in PT and agreeable to attempt recommendations.     PATIENT  EDUCATION:  12/23/23 Education details: Access Code: [email or web address] Person educated: Patient Education method: Explanation, Demonstration, Tactile cues, Verbal cues, and Handouts Education comprehension: verbalized understanding, returned demonstration, verbal cues required, tactile cues required, and needs further education  HOME EXERCISE PROGRAM: 12/23/23 Access Code: [email or web address] URL: [email or web address] Date: 12/23/2023 Prepared by: Channing Pereyra  Exercises - Seated Abdominal Press into [employer]  - 1 x daily - 7 x weekly - 1 sets - 10 reps - Quadruped Full Range Thoracic Rotation with Reach  - 1 x daily - 7 x weekly - 1 sets - 10 reps - Supine Diaphragmatic Breathing  - 1 x daily - 7 x weekly - 1 sets - 10 reps - Seated Diaphragmatic Breathing  - 1 x daily - 7 x weekly - 1 sets - 10 reps - Hooklying Isometric Hip Flexion  - 1 x daily - 3 x weekly - 1 sets - 10 reps -  Quadruped Pelvic Floor Contraction with Opposite Arm and Leg Lift  - 1 x daily - 3 x weekly - 2 sets - 10 reps - Dead Bug  - 1 x daily - 3 x weekly - 2 sets - 10 reps - Sidelying Diagonal Hip Abduction  - 1 x daily - 3 x weekly - 2 sets - 10 reps - Happy Baby with Pelvic Floor Lengthening  - 1 x daily - 3 x weekly - 1 sets - 1 reps - 30 sec hold - Supine Piriformis Stretch  - 1 x daily - 3 x weekly - 1 sets - 2 reps - 30 sec hold  ASSESSMENT:  CLINICAL IMPRESSION: Patient is a 28 y.o. female who was seen today for physical therapy evaluation and treatment for perineal pain.  Patient has a cyst on the ovary and will have it rechecked in December.  Patient was able to feel her pelvic floor relax with diaphragmatic breathing. Patient has not had vaginal penetration and does not know if this goal is appropriate.. Patient will benefit from skilled therapy to improve strength and reduce her pain.   OBJECTIVE IMPAIRMENTS: decreased activity tolerance, decreased endurance, decreased ROM, decreased strength, increased  fascial restrictions, increased muscle spasms, and pain.   ACTIVITY LIMITATIONS: squatting, sleeping, and bed mobility  PARTICIPATION LIMITATIONS: interpersonal relationship and community activity  PERSONAL FACTORS: Time since onset of injury/illness/exacerbation are also affecting patient's functional outcome.   REHAB POTENTIAL: Excellent  CLINICAL DECISION MAKING: Stable/uncomplicated  EVALUATION COMPLEXITY: Low   GOALS: Goals reviewed with patient? Yes  SHORT TERM GOALS: Target date: 01/14/24  Patient independent with initial HEP for ROM and strength.  Baseline: Goal status: INITIAL   LONG TERM GOALS: Target date: 06/16/23  Patient independent with advanced HEP for core and pelvic floor strength.  Baseline:  Goal status: INITIAL  2.  Patient is able to have vaginal penetration without pain due to improved alignment of pelvis and reduction of trigger points.  Baseline:  Goal status: deferred by patient 02/03/24  3.  Patient is able to lay on her back without pain due to increased mobility of lumbar and correct pelvic alignment.  Baseline:  Goal status: INITIAL  4.  Patient has improved quality of life due to reduction of pain.  Baseline:  Goal status: INITIAL   PLAN:  PT FREQUENCY: 1-2x/week  PT DURATION: other: 6 months  PLANNED INTERVENTIONS: 97110-Therapeutic exercises, 97530- Therapeutic activity, 97112- Neuromuscular re-education, 97535- Self Care, [id number]- Manual therapy, [id number]- Electrical stimulation (unattended), 20560 (1-2 muscles), 20561 (3+ muscles)- Dry Needling, Patient/Family education, Joint mobilization, Spinal mobilization, Cryotherapy, Moist heat, and Biofeedback  PLAN FOR NEXT SESSION:  core strength, check pelvic alignment; see if patient is to continue   Channing Pereyra, PT 02/03/24 4:53 PM

## 2024-03-08 ENCOUNTER — Encounter: Admitting: Physical Therapy

## 2024-03-08 ENCOUNTER — Telehealth: Payer: Self-pay | Admitting: Physical Therapy

## 2024-03-08 DIAGNOSIS — R1084 Generalized abdominal pain: Secondary | ICD-10-CM | POA: Insufficient documentation

## 2024-03-08 DIAGNOSIS — M5459 Other low back pain: Secondary | ICD-10-CM | POA: Insufficient documentation

## 2024-03-08 DIAGNOSIS — R252 Cramp and spasm: Secondary | ICD-10-CM | POA: Insufficient documentation

## 2024-03-08 NOTE — Telephone Encounter (Signed)
 Called patient about her missed appointment today at 15:00. Left a message.  Channing Pereyra, PT @12 /2/25@ 3:17 PM

## 2024-03-15 ENCOUNTER — Telehealth: Payer: Self-pay | Admitting: Physical Therapy

## 2024-03-15 ENCOUNTER — Encounter: Admitting: Physical Therapy

## 2024-03-15 NOTE — Telephone Encounter (Signed)
 Called patient about her missed appointment today at 15:00. She reported she forgot to cancel this appointment due to having to go to a funeral. She said she missed last weeks due to not getting an alert. Therapist explained to patient that the secretary calls all patients the day before her appointment. Therapist explained to patient the attendance poilcy and reminded her of her next appointment.  Channing Pereyra, PT @12 /9/25@ 3:26 PM

## 2024-03-22 ENCOUNTER — Encounter: Admitting: Physical Therapy

## 2024-03-22 ENCOUNTER — Encounter: Payer: Self-pay | Admitting: Physical Therapy

## 2024-03-22 DIAGNOSIS — M5459 Other low back pain: Secondary | ICD-10-CM | POA: Diagnosis present

## 2024-03-22 DIAGNOSIS — R252 Cramp and spasm: Secondary | ICD-10-CM | POA: Diagnosis present

## 2024-03-22 DIAGNOSIS — R1084 Generalized abdominal pain: Secondary | ICD-10-CM

## 2024-03-22 NOTE — Therapy (Signed)
 OUTPATIENT PHYSICAL THERAPY FEMALE PELVIC TREATMENT   Patient Name: Marisa Fowler MRN: [id number] DOB:[birth year], 28 y.o., female Today's Date: 03/22/2024  END OF SESSION:  PT End of Session - 03/22/24 1509     Visit Number 4    Date for Recertification  06/15/24    Authorization Type [employer]na [place]    PT Start Time 1500    PT Stop Time 1545    PT Time Calculation (min) 45 min    Activity Tolerance Patient tolerated treatment well    Behavior During Therapy [facility] for tasks assessed/performed          Past Medical History:  Diagnosis Date   Headache    Heart murmur    Past Surgical History:  Procedure Laterality Date   no syrgical history     WISDOM TOOTH EXTRACTION     Patient Active Problem List   Diagnosis Date Noted   Seasonal and perennial allergic rhinitis 08/01/2021   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   Dysmenorrhea 05/16/2020   Menometrorrhagia 05/16/2020   Tension headache, chronic 02/01/2019   Acute bronchitis 06/28/2016   Postconcussion syndrome 06/20/2015   Headache 06-26-2015   Head injury due to trauma June 26, 2015   Memory changes 06/26/15   Concentration deficit 2015/06/26   Unhealthy sleep habit 2015-06-26   Stress 2015/06/26   Death of family member Jun 26, 2015   DIZZINESS 03/23/2009    PCP: Lenon Nell SAILOR, FNP  REFERRING PROVIDER: Perri Bjork, PA-C   REFERRING DIAG: R10.2 (ICD-10-CM) - Pelvic and perineal pain   THERAPY DIAG:  Cramp and spasm  Generalized abdominal pain  Other low back pain  Rationale for Evaluation and Treatment: Rehabilitation  ONSET DATE: 2024  SUBJECTIVE:                                                                                                                                                                                           SUBJECTIVE STATEMENT: Patient has not had abdominal pain. She had a pelvic exam and the cysts were gone. Patient will only have back pain when standing too long.     PAIN:  Are you having pain? Yes: NPRS scale: 7/10 Pain location: lower abdominal pain Pain description: constant cramps pain Aggravating factors: randomly Relieving factors: randomly 03/22/24: Pain level 0/10     PAIN:  Are you having pain? Yes NPRS scale: 7/10 03/22/24: 3/10 in lumbar when standing too long Pain location: low back pain  Pain type: dull Pain description: intermittent   Aggravating factors: laying on her back in bed Relieving factors: lay on side  PRECAUTIONS: None  RED FLAGS: None   WEIGHT BEARING RESTRICTIONS:  No  FALLS:  Has patient fallen in last 6 months? No  OCCUPATION: teacher  ACTIVITY LEVEL : low activity  PLOF: Independent  PATIENT GOALS: reduce pain and increase pelvic floor strength  PERTINENT HISTORY:  See above Sexual abuse: No  BOWEL MOVEMENT: Pain with bowel movement: No Type of bowel movement:Type (Bristol Stool Scale) Type 3 or 4, Frequency 1-2 times per week, and Strain yes Fully empty rectum: No Leakage: No, gas leaks out Fiber supplement/laxative No  URINATION: Pain with urination: No Fully empty bladder: Yes:   Leakage: none  INTERCOURSE:  Ability to have vaginal penetration Yes  Pain with intercourse: Initial Penetration, During Penetration, Deep Penetration, and After Intercourse Dryness; Yes , no lubricant Climax: yes Marinoff Scale: 1/3 03/22/24: 0/10   PREGNANCY:no pregnancies  PROLAPSE: Pressure and Bulge   OBJECTIVE:  Note: Objective measures were completed at Evaluation unless otherwise noted.  DIAGNOSTIC FINDINGS:  none  PATIENT SURVEYS:  PFIQ-7: 14 POPIQ-7; 14 03/22/24: PFIQ-7: 0 POPIQ-7; 0  COGNITION: Overall cognitive status: Within functional limits for tasks assessed      LUMBAR SPECIAL TESTS:  SI Compression/distraction test: Positive on right  FUNCTIONAL TESTS:  Squat with increased lumbar extension    LUMBARAROM/PROM:  A/PROM A/PROM  eval A/ROM  12/23/23   Extension Decreased by 25% full  Right lateral flexion Decreased by 25% full  Left rotation Decreased by 25% full   (Blank rows = not tested)  LOWER EXTREMITY ROM: full right hip flexion but tightness in the end range.    LOWER EXTREMITY MMT:  MMT Right eval Left eval Right  03/22/24 Left 03/22/24  Hip flexion 4/5 5/5 4+/5 5/5  Hip extension 5/5 4/5 5/5 4+/5  Hip abduction 3+/5 5/5 4/5 5/5   (Blank rows = not tested) PALPATION:  Pelvic Alignment: Right ilium is anteriorly rotated 12/23/23: ASIS are equal  Abdominal: Patient will bulge her abdomen when lifting her head or legs; tenderness located in the right upper and lower quadrant; sacrum rotated left 03/22/24; No sacral rotation, no pain with hip flexion strength test                External Perineal Exam: tenderness located on perineal body                             Internal Pelvic Floor: tenderness located on the right levator ani  Patient confirms identification and approves PT to assess internal pelvic floor and treatment Yes No emotional/communication barriers or cognitive limitation. Patient is motivated to learn. Patient understands and agrees with treatment goals and plan. PT explains patient will be examined in standing, sitting, and lying down to see how their muscles and joints work. When they are ready, they will be asked to remove their underwear so PT can examine their perineum. The patient is also given the option of providing their own chaperone as one is not provided in our facility. The patient also has the right and is explained the right to defer or refuse any part of the evaluation or treatment including the internal exam. With the patient's consent, PT will use one gloved finger to gently assess the muscles of the pelvic floor, seeing how well it contracts and relaxes and if there is muscle symmetry. After, the patient will get dressed and PT and patient will discuss exam findings and plan of care. PT and  patient discuss plan of care, schedule, attendance policy and HEP activities.  PELVIC MMT:   MMT eval  Vaginal 2/5 on right and 3/5 other sides then after manual work 3/5 circular contraction  (Blank rows = not tested)          PROLAPSE: Slight anterior wall weakness  TODAY'S TREATMENT:  03/22/24 Manual: Spinal mobilization: Rotational mobilization to T10, L2, grade 3 in prone Neuromuscular re-education: Core retraining: Supine knee extension with core engaged 10 x Supine curl up with legs straight and engaging the abdominals Quadruped lift opposite extremity 10 x  Lay on side hip abduction with core engaged 10 x each Discussed with patient on finding the you tube videos on reformer board and how to exercise with core engagement Exercises: Stretches/mobility: Supine piriformis stretch holding 30 sec each Supine happy baby holding 1 minute      02/03/24 Manual: Spinal mobilization: PA mobilization of L1-L5 in prone grade 3 Neuromuscular re-education: Form correction: Supine with yoga block between knees and bring feet apart working on core and hip mobility 2 x 10  Quadruped with knee on yoga block and bring hips to the side of the yoga block to stretch the SI joint then cat camel Standing with feet apart and move 5# side to side across the body 10 x  Stand with foot ahead and reach with 5# wt in hand across the body 10 x each Quadruped lift alternate arm and leg 10 each side with tactile cues to engage the core Down training: Sit on foam roll and massage the pelvic floor.  Supine happy baby with one leg holding 30 sec each     12/23/23 Manual: Spinal mobilization: PA and rotational mobilization to L3-L5 grade 3 Neuromuscular re-education: Core retraining: Supine hip flexion isometric 10 x each side with lower abdominal contraction Supine alternate shoulder and hip flexion with core engaged 10 x each side Quadruped lift alternate arm and leg 10 each side with  tactile cues to engage the core Down training: Supine diaphragmatic breathing with opening the lower rib cage and feeling the pelvic floor relax Exercises: Stretches/mobility: Karolynn pose holding 30 sec Happy baby holding 1 minute Supine piriformis stretch holding 30 sec each   PATIENT EDUCATION:  03/22/24 Education details: Access Code: [email or web address] Person educated: Patient Education method: Explanation, Demonstration, Tactile cues, Verbal cues, and Handouts Education comprehension: verbalized understanding, returned demonstration, verbal cues required, tactile cues required, and needs further education  HOME EXERCISE PROGRAM: 03/22/24 Access Code: [email or web address] URL: [email or web address] Date: 03/22/2024 Prepared by: Channing Pereyra  Exercises - Quadruped Full Range Thoracic Rotation with Reach  - 1 x daily - 7 x weekly - 1 sets - 10 reps - Quadruped Pelvic Floor Contraction with Opposite Arm and Leg Lift  - 1 x daily - 3 x weekly - 2 sets - 10 reps - Dead Bug  - 1 x daily - 3 x weekly - 2 sets - 10 reps - Sidelying Diagonal Hip Abduction  - 1 x daily - 3 x weekly - 2 sets - 10 reps - Happy Baby with Pelvic Floor Lengthening  - 1 x daily - 3 x weekly - 1 sets - 1 reps - 30 sec hold - Supine Figure 4 Piriformis Stretch with Leg Extension  - 1 x daily - 3 x weekly - 1 sets - 2 reps - 30 sec hold  ASSESSMENT:  CLINICAL IMPRESSION: Patient is a 28 y.o. female who was seen today for physical therapy evaluation and treatment for perineal pain. Patient is not having lower abdominal pain. She is not  having pain with penile penetration vaginally. Her back pain is reduced significantly. She is doing Pilates at a studio.  She understands how to engage her core correctly with her exercise to reduce stress on her back    OBJECTIVE IMPAIRMENTS: decreased activity tolerance, decreased endurance, decreased ROM, decreased strength, increased fascial restrictions, increased muscle spasms, and  pain.   ACTIVITY LIMITATIONS: squatting, sleeping, and bed mobility  PARTICIPATION LIMITATIONS: interpersonal relationship and community activity  PERSONAL FACTORS: Time since onset of injury/illness/exacerbation are also affecting patient's functional outcome.   REHAB POTENTIAL: Excellent  CLINICAL DECISION MAKING: Stable/uncomplicated  EVALUATION COMPLEXITY: Low   GOALS: Goals reviewed with patient? Yes  SHORT TERM GOALS: Target date: 01/14/24  Patient independent with initial HEP for ROM and strength.  Baseline: Goal status: INITIAL   LONG TERM GOALS: Target date: 06/16/23  Patient independent with advanced HEP for core and pelvic floor strength.  Baseline:  Goal status: Met 03/22/24  2.  Patient is able to have vaginal penetration without pain due to improved alignment of pelvis and reduction of trigger points.  Baseline:  Goal status: deferred by patient 02/03/24  3.  Patient is able to lay on her back without pain due to increased mobility of lumbar and correct pelvic alignment.  Baseline:  Goal status: Met  03/22/24  4.  Patient has improved quality of life due to reduction of pain.  Baseline:  Goal status: Met 03/22/24   PLAN: Discharge to HEP this visit    Channing Pereyra, PT 03/22/2024 3:10 PM   PHYSICAL THERAPY DISCHARGE SUMMARY  Visits from Start of Care: 4  Current functional level related to goals / functional outcomes: See above.    Remaining deficits: See above.    Education / Equipment: HEP   Patient agrees to discharge. Patient goals were met. Patient is being discharged due to meeting the stated rehab goals. Thank you for the referral.   Channing Pereyra, PT 03/22/2024 3:50 PM

## 2024-05-02 ENCOUNTER — Telehealth

## 2024-05-04 ENCOUNTER — Telehealth: Admitting: Family Medicine

## 2024-05-04 DIAGNOSIS — J111 Influenza due to unidentified influenza virus with other respiratory manifestations: Secondary | ICD-10-CM

## 2024-05-04 DIAGNOSIS — J209 Acute bronchitis, unspecified: Secondary | ICD-10-CM | POA: Diagnosis not present

## 2024-05-04 MED ORDER — AZITHROMYCIN 250 MG PO TABS: ORAL_TABLET | ORAL | 0 refills | Status: AC

## 2024-05-04 NOTE — Progress Notes (Signed)
 " Virtual Visit Consent   Marisa Fowler, you are scheduled for a virtual visit with a [facility] provider today. Just as with appointments in the office, your consent must be obtained to participate. Your consent will be active for this visit and any virtual visit you may have with one of our providers in the next 365 days. If you have a MyChart account, a copy of this consent can be sent to you electronically.  As this is a virtual visit, video technology does not allow for your provider to perform a traditional examination. This may limit your provider's ability to fully assess your condition. If your provider identifies any concerns that need to be evaluated in person or the need to arrange testing (such as labs, EKG, etc.), we will make arrangements to do so. Although advances in technology are sophisticated, we cannot ensure that it will always work on either your end or our end. If the connection with a video visit is poor, the visit may have to be switched to a telephone visit. With either a video or telephone visit, we are not always able to ensure that we have a secure connection.  By engaging in this virtual visit, you consent to the provision of healthcare and authorize for your insurance to be billed (if applicable) for the services provided during this visit. Depending on your insurance coverage, you may receive a charge related to this service.  I need to obtain your verbal consent now. Are you willing to proceed with your visit today? Marisa Fowler has provided verbal consent on 05/04/2024 for a virtual visit (video or telephone). Marisa Lamp, FNP  Date: 05/04/2024 7:30 PM   Virtual Visit via Video Note   I, Marisa Fowler, connected with  Marisa Fowler  ([id number], Mar 26, 1996) on 05/04/24 at  7:30 PM EST by a video-enabled telemedicine application and verified that I am speaking with the correct person using two identifiers.  Location: Patient: Virtual Visit  Location Patient: Home Provider: Virtual Visit Location Provider: Home Office   I discussed the limitations of evaluation and management by telemedicine and the availability of in person appointments. The patient expressed understanding and agreed to proceed.    History of Present Illness: Marisa Fowler is a 29 y.o. who identifies as a female who was assigned female at birth, and is being seen today for flu for 6 days with deep cough, worsening, no fever now. History of pneumonia. In no distress. 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  Resting comfortably  at home.  Head is normocephalic, atraumatic.  No labored breathing.  Speech is clear and coherent with logical content.  Patient is alert and oriented at baseline.    Assessment and Plan: 1. Influenza (Primary)  2. Acute bronchitis, unspecified organism  Increase fluids, humidifier at night, tylenol  or ibuprofen as directed, UC if sx  worsen. Discussed this as part of flu and she says she will hold it 2-3 days and not start if sx improve.   Follow Up Instructions: I discussed the assessment and treatment plan with the patient. The patient was provided an opportunity to ask questions and all were answered. The patient agreed with the plan and  demonstrated an understanding of the instructions.  A copy of instructions were sent to the patient via MyChart unless otherwise noted below.     The patient was advised to call back or seek an in-person evaluation if the symptoms worsen or if the condition fails to improve as anticipated.    Janan Bogie, FNP     [1]  Allergies Allergen Reactions   Kiwi Extract Swelling    Lips    Pineapple Itching  [2]  Current Outpatient Medications:    azithromycin  (ZITHROMAX ) 250 MG tablet, Take 2 tablets on day 1, then 1 tablet daily on days 2 through 5, Disp: 6 tablet, Rfl: 0   albuterol  (VENTOLIN  HFA) 108 (90 Base) MCG/ACT inhaler, Inhale 2 puffs into the lungs every 6 (six) hours as needed for wheezing or shortness of breath., Disp: 8 g, Rfl: 2   ALPRAZolam  (XANAX ) 0.25 MG tablet, Take 1-2 tabs (0.25mg -0.50mg ) 30-60 minutes before procedure. May repeat if needed.Do not drive., Disp: 4 tablet, Rfl: 0   JUNEL FE 1/20 1-20 MG-MCG tablet, Take 1 tablet by mouth daily., Disp: , Rfl:    ketorolac  (TORADOL ) 10 MG tablet, Take 1 tablet (10 mg total) by mouth every 6 (six) hours as needed., Disp: 20 tablet, Rfl: 0  "

## 2024-05-04 NOTE — Patient Instructions (Signed)
